# Patient Record
Sex: Female | Born: 1940
Health system: Southern US, Community
[De-identification: ages and names within clinical notes are randomized; demographics above are authoritative.]

## PROBLEM LIST (undated history)

## (undated) DIAGNOSIS — F419 Anxiety disorder, unspecified: Secondary | ICD-10-CM

## (undated) DIAGNOSIS — K449 Diaphragmatic hernia without obstruction or gangrene: Secondary | ICD-10-CM

## (undated) DIAGNOSIS — M353 Polymyalgia rheumatica: Secondary | ICD-10-CM

## (undated) DIAGNOSIS — H9190 Unspecified hearing loss, unspecified ear: Secondary | ICD-10-CM

## (undated) DIAGNOSIS — G039 Meningitis, unspecified: Secondary | ICD-10-CM

## (undated) DIAGNOSIS — IMO0002 Reserved for concepts with insufficient information to code with codable children: Secondary | ICD-10-CM

## (undated) DIAGNOSIS — H269 Unspecified cataract: Secondary | ICD-10-CM

## (undated) DIAGNOSIS — I080 Rheumatic disorders of both mitral and aortic valves: Secondary | ICD-10-CM

## (undated) HISTORY — PX: CATARACT EXTRACTION: SUR2

## (undated) HISTORY — DX: Meningitis, unspecified: G03.9

## (undated) HISTORY — DX: Unspecified cataract: H26.9

## (undated) HISTORY — PX: UPPER GI ENDOSCOPY: SHX6162

## (undated) HISTORY — DX: Diaphragmatic hernia without obstruction or gangrene: K44.9

## (undated) HISTORY — DX: Reserved for concepts with insufficient information to code with codable children: IMO0002

## (undated) HISTORY — DX: Polymyalgia rheumatica: M35.3

## (undated) HISTORY — DX: Anxiety disorder, unspecified: F41.9

## (undated) HISTORY — DX: Rheumatic disorders of both mitral and aortic valves: I08.0

## (undated) HISTORY — DX: Unspecified hearing loss, unspecified ear: H91.90

---

## 1987-09-02 HISTORY — PX: OTHER SURGICAL HISTORY: SHX169

## 1987-09-02 HISTORY — PX: ABDOMINAL HYSTERECTOMY: SHX81

## 2007-09-02 HISTORY — PX: COLONOSCOPY: SHX174

## 2008-09-01 DIAGNOSIS — IMO0002 Reserved for concepts with insufficient information to code with codable children: Secondary | ICD-10-CM

## 2008-09-01 HISTORY — DX: Reserved for concepts with insufficient information to code with codable children: IMO0002

## 2009-03-22 LAB — HM COLONOSCOPY

## 2009-06-03 ENCOUNTER — Emergency Department: Payer: Self-pay | Admitting: Emergency Medicine

## 2009-06-14 DIAGNOSIS — F411 Generalized anxiety disorder: Secondary | ICD-10-CM | POA: Insufficient documentation

## 2009-10-04 DIAGNOSIS — R7303 Prediabetes: Secondary | ICD-10-CM | POA: Insufficient documentation

## 2009-10-04 DIAGNOSIS — K2940 Chronic atrophic gastritis without bleeding: Secondary | ICD-10-CM | POA: Insufficient documentation

## 2009-10-04 DIAGNOSIS — M353 Polymyalgia rheumatica: Secondary | ICD-10-CM | POA: Insufficient documentation

## 2009-10-04 DIAGNOSIS — K648 Other hemorrhoids: Secondary | ICD-10-CM | POA: Insufficient documentation

## 2009-10-18 ENCOUNTER — Encounter: Payer: Self-pay | Admitting: Cardiovascular Disease

## 2009-10-18 DIAGNOSIS — I080 Rheumatic disorders of both mitral and aortic valves: Secondary | ICD-10-CM | POA: Insufficient documentation

## 2009-10-18 LAB — HM PAP SMEAR: HM Pap smear: NEGATIVE

## 2009-10-22 DIAGNOSIS — E7800 Pure hypercholesterolemia, unspecified: Secondary | ICD-10-CM | POA: Insufficient documentation

## 2010-03-12 ENCOUNTER — Ambulatory Visit: Payer: Self-pay | Admitting: Family Medicine

## 2010-04-16 ENCOUNTER — Encounter: Payer: Self-pay | Admitting: Cardiovascular Disease

## 2010-04-26 ENCOUNTER — Ambulatory Visit: Payer: Self-pay | Admitting: Cardiovascular Disease

## 2010-04-26 DIAGNOSIS — R002 Palpitations: Secondary | ICD-10-CM | POA: Insufficient documentation

## 2010-04-26 DIAGNOSIS — I080 Rheumatic disorders of both mitral and aortic valves: Secondary | ICD-10-CM | POA: Insufficient documentation

## 2010-04-29 ENCOUNTER — Telehealth: Payer: Self-pay | Admitting: Cardiovascular Disease

## 2010-10-01 NOTE — Letter (Signed)
Summary: PHI  PHI   Imported By: Harlon Flor 05/13/2010 15:57:08  _____________________________________________________________________  External Attachment:    Type:   Image     Comment:   External Document

## 2010-10-01 NOTE — Assessment & Plan Note (Signed)
Summary: NP6/AMD   Visit Type:  Initial Consult Primary Provider:  Danford Bad Smith,M.D.  CC:  Occas. has palpitations and shortness of breath.  Has hx of mitral and arota valve insufficency. Denies dizziness or chest pain.Marland Kitchen  History of Present Illness: Lindsey Vaughan is a very pleasant 70 year old woman with a long history of palpitations, polymyalgia rheumatica, anxiety, hyperlipidemia who presents for evaluation of palpitations and valvular insufficiency.  Overall, she reports that she has been doing very well. She is active with no symptoms of shortness of breath. She denies worsening lower extremity edema. Her palpitations have significantly improved on Lexapro and on beta blockers. Overall, she is happy with no complaints. She was recently started on simvastatin and aspirin. She swims 30 minutes 3 times per week with no significant symptoms and no change in her exertional capacity over several years.  EKG from Dr. Katrinka Blazing office shows normal sinus rhythm with rate 83 beats per minute, no significant ST or T wave changes.  Labs from 04/2010 shows total cholesterol 232, LDL 153, HDL 56, triglycerides 116  Current Medications (verified): 1)  Prilosec 20 Mg Cpdr (Omeprazole) .... One Tablet Once Daily 2)  Lexapro 10 Mg Tabs (Escitalopram Oxalate) .... One Tablet Once Daily For Anxiety 3)  Inderal La 160 Mg Xr24h-Cap (Propranolol Hcl) .... One Tablet Once Daily 4)  Opththalmic Solution 0.05% ( Eye Drop) .... One Drop Twice A Day. 5)  Calcium/vitamin D/minerals 600-200 Mg-Unit Tabs (Calcium Carbonate-Vit D-Min) .... One Tablet Once Daily 6)  B Complex  Tabs (B Complex Vitamins) .... One Tablet Once Daily 7)  Magnesium 500 Mg Tabs (Magnesium) .... One Tablet Once Daily 8)  Fish Oil Concentrate 1000 Mg Caps (Omega-3 Fatty Acids) .... Two Tablet Daily 9)  Simvastatin 20 Mg Tabs (Simvastatin) .... One Tablet At Bedtime  Allergies (verified): No Known Drug Allergies  Past History:  Past Surgical  History: Last updated: 04/19/2010 hysterectomy. 1989--prolapsed cervix colonoscopy.2009 colonoscopy polymyalgia rheumatica 2010 squamous cell carcinoma upper back 2010  Family History: Last updated: 04/19/2010 Family History of Coronary Artery Disease: Father deceased age 62 with MI. Mother deceased age 58 with pneumonia. She had illness such as CAD, hypertension and tobacco abuse.  Social History: Last updated: 04/19/2010 Married Retired  Tobacco Use - No.  Alcohol Use - yes--two glasses wine on the weekend Regular Exercise - yes--swims,walks and attends exercise class. Swims twice a week. Walks four day a week.  Risk Factors: Exercise: yes (04/19/2010)  Risk Factors: Smoking Status: never (04/19/2010)  Past Medical History: Hypercholesterolemia anxiety Mitral & Aortic valve insufficency; shown on Echo 2006 in [place] GERD hiatal hernia cataract  polymyalgia rhematica  partial deaf left ear spinal meningitis at 40 months old squamous skin cancer upper back 2010 glucose intolerance palpitations  Review of Systems  The patient denies fever, weight loss, weight gain, vision loss, decreased hearing, hoarseness, chest pain, syncope, dyspnea on exertion, peripheral edema, prolonged cough, abdominal pain, incontinence, muscle weakness, depression, and enlarged lymph nodes.         Rare palpitations  Vital Signs:  Patient profile:   70 year old female Height:      66 inches Weight:      157 pounds BMI:     25.43 Pulse rate:   76 / minute BP sitting:   150 / 78  (left arm) Cuff size:   regular  Vitals Entered By: Bishop Dublin, CMA (April 26, 2010 3:30 PM)  Physical Exam  General:  Well developed, well nourished, in  no acute distress. Head:  normocephalic and atraumatic Neck:  Neck supple, no JVD. No masses, thyromegaly or abnormal cervical nodes. Lungs:  Clear bilaterally to auscultation and percussion. Heart:  Non-displaced PMI, chest non-tender; regular  rate and rhythm, S1, S2 without murmurs, rubs or gallops. Carotid upstroke normal, no bruit.  Pedals normal pulses. No edema, no varicosities. Abdomen:  abdomen soft and non-tender without masses Msk:  Back normal, normal gait. Muscle strength and tone normal. Pulses:  pulses normal in all 4 extremities Extremities:  No clubbing or cyanosis. Neurologic:  Alert and oriented x 3. Skin:  Intact without lesions or rashes. Psych:  Normal affect.   Impression & Recommendations:  Problem # 1:  MITRAL REGURG W/ AORTIC INSUFF, RHEUM/NON-RHEUM (ICD-396.3) by report, she has mild aortic and mitral valve regurgitation. She has no clinical signs of worsening valvular disease. This is also not audible on clinical exam. She has good exercise tolerance. I discussed this with her and she feels comfortable and holding off on a repeat echocardiogram at this time. Certainly if she is concerned about her valvular disease or has worsening symptoms, we could repeat an echocardiogram at any time and I have suggested that she call our office to schedule the echocardiogram.  Problem # 2:  PALPITATIONS (ICD-785.1) she has had a long history of palpitations. She is not particularly concerned with her rare ectopy and it seems to be well-controlled on Lexapro and beta blockers.  Her updated medication list for this problem includes:    Inderal La 160 Mg Xr24h-cap (Propranolol hcl) ..... One tablet once daily  Problem # 3:  HYPERLIPIDEMIA-MIXED (ICD-272.4) We did to her about her lipids. I encouraged her to start her simvastatin 20 mg daily. I suspect that she may need a higher dose or a stronger medicine. Potential goal LDL could be LDL less than 100. To achieve this, we might need Lipitor when it goes generic.  Her updated medication list for this problem includes:    Simvastatin 20 Mg Tabs (Simvastatin) ..... One tablet at bedtime  Patient Instructions: 1)  Your physician recommends that you schedule a follow-up  appointment in: as needed  2)  Your physician recommends that you continue on your current medications as directed. Please refer to the Current Medication list given to you today.

## 2010-10-01 NOTE — Progress Notes (Signed)
Summary: MEDICATION  Phone Note Call from Patient Call back at Home Phone [phone]   Caller: SELF Call For: [facility] Summary of Call: PT FORGOT TO GET THE RX FROM [facility] FOR PRE MEDICATION FOR DENTAL WORK-WOULD LIKE TO GET THAT NOW-SHE THINKS IT IS AMOXYCILLAN Initial call taken by: Harlon Flor,  April 29, 2010 12:22 PM  Follow-up for Phone Call        [facility] to call office Follow-up by: Bishop Dublin, CMA,  April 30, 2010 1:54 PM  Additional Follow-up for Phone Call Additional follow up Details #1::        notified patient regarding antibiotic prior to dental work.  She said if needs it just to call and let her know.  She doesn't have any dental work any time soon just was not sure if needed. Additional Follow-up by: Bishop Dublin, CMA,  May 01, 2010 10:12 AM    Additional Follow-up for Phone Call Additional follow up Details #2::    NO antibiotic needed before dental procedures. She does not have mechanical valve/surgical valve   Appended Document: MEDICATION Notified patient does not need pre medication for dental procedures.

## 2011-03-17 ENCOUNTER — Encounter: Payer: Self-pay | Admitting: Cardiovascular Disease

## 2011-06-13 ENCOUNTER — Encounter: Payer: Self-pay | Admitting: Cardiovascular Disease

## 2011-06-13 ENCOUNTER — Ambulatory Visit: Payer: Self-pay | Admitting: Family Medicine

## 2011-06-17 ENCOUNTER — Encounter: Payer: Self-pay | Admitting: Cardiovascular Disease

## 2011-06-17 ENCOUNTER — Ambulatory Visit (INDEPENDENT_AMBULATORY_CARE_PROVIDER_SITE_OTHER): Payer: Medicare Other | Admitting: Cardiovascular Disease

## 2011-06-17 DIAGNOSIS — E785 Hyperlipidemia, unspecified: Secondary | ICD-10-CM

## 2011-06-17 DIAGNOSIS — I080 Rheumatic disorders of both mitral and aortic valves: Secondary | ICD-10-CM

## 2011-06-17 DIAGNOSIS — R002 Palpitations: Secondary | ICD-10-CM

## 2011-06-17 NOTE — Assessment & Plan Note (Signed)
Reported history of trace to mild mitral and aortic valve insufficiency in [place] on echocardiography in 2006. She does not have any clinical signs of significant valvular regurgitation. She is active. Clinical exam is essentially benign. No further testing is needed at this time. We have suggested that she can followup on an as-needed basis.

## 2011-06-17 NOTE — Progress Notes (Signed)
Patient ID: Lindsey Vaughan, female    DOB: 19-Apr-1941, 70 y.o.   MRN: [id number]  HPI Comments: Lindsey Vaughan is a very pleasant 70 year old woman with a long history of palpitations, polymyalgia rheumatica, anxiety, hyperlipidemia who presents for Routine followup for her aortic and mitral valve insufficiency and palpitations.   she reports that she has been doing very well. She is active with no symptoms of shortness of breath. She denies worsening lower extremity edema. Her palpitations have significantly improved No she does continue to have occasional episodes during the daytime. She is not particularly interested in changing her medications for these palpitations. Overall, she is happy with no complaints. She is taking  simvastatin and aspiri 81 mg daily. She swims 30 minutes 3 times per week with no significant symptoms and no change in her exertional capacity over several years.   EKG from Dr. Katrinka Blazing office shows normal sinus rhythm with rate 76 beats per minute, no significant ST or T wave changes. Most recent cholesterol panel is not available for our review at this time    Outpatient Encounter Prescriptions as of 06/17/2011  Medication Sig Dispense Refill  . aspirin 81 MG tablet Take 81 mg by mouth daily.        Marland Kitchen b complex vitamins tablet Take 1 tablet by mouth daily.        . Calcium Carbonate-Vit D-Min (CALCIUM/VITAMIN D/MINERALS) 600-200 MG-UNIT TABS Take 1 tablet by mouth daily.        Marland Kitchen escitalopram (LEXAPRO) 10 MG tablet Take 10 mg by mouth daily.        . magnesium gluconate (MAGONATE) 500 MG tablet Take 500 mg by mouth 2 (two) times daily.        . Omega-3 Fatty Acids (FISH OIL CONCENTRATE) 1000 MG CAPS Take 2 capsules by mouth daily.        Marland Kitchen omeprazole (PRILOSEC) 20 MG capsule Take 20 mg by mouth daily.        . propranolol (INDERAL LA) 160 MG SR capsule Take 160 mg by mouth daily.        . simvastatin (ZOCOR) 20 MG tablet Take 20 mg by mouth at bedtime.        Marland Kitchen  tetrahydrozoline 0.05 % ophthalmic solution 1 drop 2 (two) times daily.           Review of Systems  Constitutional: Negative.   HENT: Negative.   Eyes: Negative.   Respiratory: Negative.   Cardiovascular: Negative.   Gastrointestinal: Negative.   Musculoskeletal: Negative.   Skin: Negative.   Neurological: Negative.   Hematological: Negative.   Psychiatric/Behavioral: Negative.   All other systems reviewed and are negative.    BP 120/70  Pulse 76  Ht 5\' 6"  (1.676 m)  Wt 153 lb (69.4 kg)  BMI 24.69 kg/m2   Physical Exam  Nursing note and vitals reviewed. Constitutional: She is oriented to person, place, and time. She appears well-developed and well-nourished.  HENT:  Head: Normocephalic.  Nose: Nose normal.  Mouth/Throat: Oropharynx is clear and moist.  Eyes: Conjunctivae are normal. Pupils are equal, round, and reactive to light.  Neck: Normal range of motion. Neck supple. No JVD present.  Cardiovascular: Normal rate, regular rhythm, S1 normal, S2 normal, normal heart sounds and intact distal pulses.  Exam reveals no gallop and no friction rub.   No murmur heard. Pulmonary/Chest: Effort normal and breath sounds normal. No respiratory distress. She has no wheezes. She has no rales. She exhibits no tenderness.  Abdominal: Soft. Bowel sounds are normal. She exhibits no distension. There is no tenderness.  Musculoskeletal: Normal range of motion. She exhibits no edema and no tenderness.  Lymphadenopathy:    She has no cervical adenopathy.  Neurological: She is alert and oriented to person, place, and time. Coordination normal.  Skin: Skin is warm and dry. No rash noted. No erythema.  Psychiatric: She has a normal mood and affect. Her behavior is normal. Judgment and thought content normal.         Assessment and Plan

## 2011-06-17 NOTE — Patient Instructions (Signed)
You are doing well. No medication changes were made.  Please call us if you have new issues that need to be addressed before your next appt.  The office will contact you for a follow up Appt. In 12 months  

## 2011-06-17 NOTE — Assessment & Plan Note (Signed)
Complemented her on her compliance with the statin and aspirin. She does have a very strong family history of stroke and coronary artery disease as well as heart failure. I would suggest aggressive treatment of her lipids given his strong history. She does report her sister recently had 2 strokes.

## 2011-06-17 NOTE — Assessment & Plan Note (Addendum)
She is not particularly bothered by her palpitations. They are occasional. No further medication changes were made. She could add propranolol short-acting p.r.n. For breakthrough palpitations if needed.

## 2011-12-31 HISTORY — PX: CATARACT EXTRACTION: SUR2

## 2012-01-14 ENCOUNTER — Ambulatory Visit: Payer: Self-pay | Admitting: Ophthalmology

## 2012-01-26 ENCOUNTER — Ambulatory Visit: Payer: Self-pay | Admitting: Ophthalmology

## 2012-05-24 ENCOUNTER — Ambulatory Visit (INDEPENDENT_AMBULATORY_CARE_PROVIDER_SITE_OTHER): Payer: Medicare Other | Admitting: Cardiovascular Disease

## 2012-05-24 ENCOUNTER — Encounter: Payer: Self-pay | Admitting: Cardiovascular Disease

## 2012-05-24 VITALS — BP 140/80 | HR 76 | Ht 66.0 in | Wt 153.2 lb

## 2012-05-24 DIAGNOSIS — I080 Rheumatic disorders of both mitral and aortic valves: Secondary | ICD-10-CM

## 2012-05-24 DIAGNOSIS — R002 Palpitations: Secondary | ICD-10-CM

## 2012-05-24 DIAGNOSIS — I10 Essential (primary) hypertension: Secondary | ICD-10-CM

## 2012-05-24 MED ORDER — PROPRANOLOL HCL 20 MG PO TABS: 20.0000 mg | ORAL_TABLET | Freq: Three times a day (TID) | ORAL | Status: DC | PRN

## 2012-05-24 NOTE — Progress Notes (Signed)
Patient ID: Lindsey Vaughan, female    DOB: [birth year], 71 y.o.   MRN: [id number]  HPI Comments: Ms. Lindsey Vaughan is a very pleasant 71 year old woman with a long history of palpitations, polymyalgia rheumatica, anxiety, hyperlipidemia who presents for Routine followup for her aortic and mitral valve insufficiency and palpitations.   she reports that she has been doing very well. She is active with no symptoms of shortness of breath. She denies worsening lower extremity edema. Her palpitations have significantly improved.  She does report having periodic irregular heart beats, also periodically her blood pressure will be very low. She was started initially on propranolol years ago for palpitations and ectopy. Occasional systolic pressures in the low 409W, heart rate in the 50s to 60s  EKG from Dr. Katrinka Blazing office shows normal sinus rhythm with rate 63 beats per minute, no significant ST or T wave changes.      Outpatient Encounter Prescriptions as of 05/24/2012  Medication Sig Dispense Refill  . aspirin 81 MG tablet Take 81 mg by mouth 2 (two) times daily.       Marland Kitchen b complex vitamins tablet Take 1 tablet by mouth daily.        . Calcium Carbonate-Vit D-Min (CALCIUM/VITAMIN D/MINERALS) 600-200 MG-UNIT TABS Take 1 tablet by mouth daily.        Marland Kitchen doxycycline (VIBRAMYCIN) 100 MG capsule Take 100 mg by mouth daily.      Marland Kitchen escitalopram (LEXAPRO) 10 MG tablet Take 10 mg by mouth daily.        . magnesium gluconate (MAGONATE) 500 MG tablet Take 500 mg by mouth 2 (two) times daily.        . Omega-3 Fatty Acids (FISH OIL CONCENTRATE) 1000 MG CAPS Take 2 capsules by mouth daily.        Marland Kitchen omeprazole (PRILOSEC) 20 MG capsule Take 20 mg by mouth daily.        . propranolol (INDERAL LA) 60 MG SR capsule Take 60 mg by mouth daily.        . simvastatin (ZOCOR) 20 MG tablet Take 20 mg by mouth at bedtime.        Marland Kitchen tetrahydrozoline 0.05 % ophthalmic solution 1 drop 2 (two) times daily.           Review of Systems    Constitutional: Negative.   HENT: Negative.   Eyes: Negative.   Respiratory: Negative.   Cardiovascular: Positive for palpitations.  Gastrointestinal: Negative.   Musculoskeletal: Negative.   Skin: Negative.   Neurological: Positive for dizziness.  Hematological: Negative.   Psychiatric/Behavioral: Negative.   All other systems reviewed and are negative.    BP 140/80  Pulse 76  Ht 5\' 6"  (1.676 m)  Wt 153 lb 4 oz (69.514 kg)  BMI 24.74 kg/m2  Physical Exam  Nursing note and vitals reviewed. Constitutional: She is oriented to person, place, and time. She appears well-developed and well-nourished.  HENT:  Head: Normocephalic.  Nose: Nose normal.  Mouth/Throat: Oropharynx is clear and moist.  Eyes: Conjunctivae normal are normal. Pupils are equal, round, and reactive to light.  Neck: Normal range of motion. Neck supple. No JVD present.  Cardiovascular: Normal rate, regular rhythm, S1 normal, S2 normal, normal heart sounds and intact distal pulses.  Exam reveals no gallop and no friction rub.   No murmur heard. Pulmonary/Chest: Effort normal and breath sounds normal. No respiratory distress. She has no wheezes. She has no rales. She exhibits no tenderness.  Abdominal: Soft. Bowel sounds are  normal. She exhibits no distension. There is no tenderness.  Musculoskeletal: Normal range of motion. She exhibits no edema and no tenderness.  Lymphadenopathy:    She has no cervical adenopathy.  Neurological: She is alert and oriented to person, place, and time. Coordination normal.  Skin: Skin is warm and dry. No rash noted. No erythema.  Psychiatric: She has a normal mood and affect. Her behavior is normal. Judgment and thought content normal.         Assessment and Plan

## 2012-05-24 NOTE — Assessment & Plan Note (Signed)
We'll decrease her propranolol given recent hypotension

## 2012-05-24 NOTE — Assessment & Plan Note (Signed)
She reports having low blood pressure on propranolol 60 mg daily. We will change her prescription to propranolol 20 mg twice a day. If blood pressure continues to run low, she can decrease the dose even further.

## 2012-05-24 NOTE — Patient Instructions (Addendum)
You are doing well. Please change propranolol from 60 mg daily to 20 mg twice a day  Please call us if you have new issues that need to be addressed before your next appt.  Your physician wants you to follow-up in: 12months.  You will receive a reminder letter in the mail two months in advance. If you don't receive a letter, please call our office to schedule the follow-up appointment.

## 2012-05-24 NOTE — Assessment & Plan Note (Signed)
No clinical signs of worsening aortic or mitral valve disease. Clinically, no murmur appreciated.

## 2012-06-24 ENCOUNTER — Ambulatory Visit: Payer: Self-pay | Admitting: Family Medicine

## 2012-06-24 LAB — HM DEXA SCAN

## 2012-07-08 ENCOUNTER — Ambulatory Visit: Payer: Medicare Other | Admitting: Internal Medicine

## 2012-09-01 DIAGNOSIS — H269 Unspecified cataract: Secondary | ICD-10-CM

## 2012-09-01 HISTORY — DX: Unspecified cataract: H26.9

## 2012-10-05 ENCOUNTER — Ambulatory Visit: Payer: Self-pay | Admitting: Family Medicine

## 2012-10-16 ENCOUNTER — Other Ambulatory Visit: Payer: Self-pay

## 2013-01-13 ENCOUNTER — Ambulatory Visit: Payer: Self-pay | Admitting: Ophthalmology

## 2013-04-06 ENCOUNTER — Other Ambulatory Visit: Payer: Self-pay

## 2013-05-30 ENCOUNTER — Ambulatory Visit: Payer: Medicare Other | Admitting: Cardiovascular Disease

## 2013-07-04 DIAGNOSIS — N393 Stress incontinence (female) (male): Secondary | ICD-10-CM | POA: Insufficient documentation

## 2013-07-04 DIAGNOSIS — R3129 Other microscopic hematuria: Secondary | ICD-10-CM | POA: Insufficient documentation

## 2013-07-07 ENCOUNTER — Other Ambulatory Visit: Payer: Self-pay

## 2013-07-11 DIAGNOSIS — N8111 Cystocele, midline: Secondary | ICD-10-CM | POA: Insufficient documentation

## 2013-07-11 DIAGNOSIS — N362 Urethral caruncle: Secondary | ICD-10-CM | POA: Insufficient documentation

## 2013-07-26 ENCOUNTER — Ambulatory Visit (INDEPENDENT_AMBULATORY_CARE_PROVIDER_SITE_OTHER): Payer: Medicare Other | Admitting: Cardiovascular Disease

## 2013-07-26 ENCOUNTER — Encounter: Payer: Self-pay | Admitting: Cardiovascular Disease

## 2013-07-26 VITALS — BP 122/68 | HR 72 | Ht 66.5 in | Wt 151.5 lb

## 2013-07-26 DIAGNOSIS — I10 Essential (primary) hypertension: Secondary | ICD-10-CM

## 2013-07-26 DIAGNOSIS — E785 Hyperlipidemia, unspecified: Secondary | ICD-10-CM

## 2013-07-26 DIAGNOSIS — Z01810 Encounter for preprocedural cardiovascular examination: Secondary | ICD-10-CM | POA: Insufficient documentation

## 2013-07-26 DIAGNOSIS — I080 Rheumatic disorders of both mitral and aortic valves: Secondary | ICD-10-CM

## 2013-07-26 DIAGNOSIS — R002 Palpitations: Secondary | ICD-10-CM

## 2013-07-26 NOTE — Assessment & Plan Note (Signed)
Clinically not a significant finding. No murmur on exam   

## 2013-07-26 NOTE — Assessment & Plan Note (Signed)
Blood pressure is well controlled on today's visit. No changes made to the medications. 

## 2013-07-26 NOTE — Assessment & Plan Note (Signed)
Acceptable risk for upcoming cataract surgery. No further workup needed

## 2013-07-26 NOTE — Assessment & Plan Note (Signed)
Encouraged her to stay on her statin 

## 2013-07-26 NOTE — Patient Instructions (Signed)
You are doing well. No medication changes were made.  Please call us if you have new issues that need to be addressed before your next appt.  Your physician wants you to follow-up in: 12 months.  You will receive a reminder letter in the mail two months in advance. If you don't receive a letter, please call our office to schedule the follow-up appointment. 

## 2013-07-26 NOTE — Progress Notes (Signed)
Patient ID: Lindsey Vaughan, female    DOB: [birth year], 72 y.o.   MRN: [id number]  HPI Comments: Lindsey Vaughan is a very pleasant 72 year old woman with a long history of palpitations, polymyalgia rheumatica, anxiety, hyperlipidemia who presents for routine followup for her aortic and mitral valve insufficiency and palpitations.   she reports that she has been doing very well. She is active with no symptoms of shortness of breath. She denies worsening lower extremity edema. Her palpitations have significantly improved on beta blockers. She is exercising 3-4 times per week, active on other days. No symptoms noted. She is scheduled for cataract surgery in December 2014 Weight is well controlled. Overall she is happy with her current status. Previous cholesterol 159, LDL 75 in March 2013  EKG shows normal sinus rhythm with no significant ST or T wave changes.      Outpatient Encounter Prescriptions as of 07/26/2013  Medication Sig  . aspirin 81 MG tablet Take 81 mg by mouth 2 (two) times daily.   Marland Kitchen b complex vitamins tablet Take 1 tablet by mouth daily.    . Calcium Carbonate-Vit D-Min (CALCIUM/VITAMIN D/MINERALS) 600-200 MG-UNIT TABS Take 1 tablet by mouth daily.    Marland Kitchen doxycycline (VIBRAMYCIN) 100 MG capsule Take 100 mg by mouth daily.  Marland Kitchen escitalopram (LEXAPRO) 10 MG tablet Take 10 mg by mouth daily.    . magnesium gluconate (MAGONATE) 500 MG tablet Take 500 mg by mouth 2 (two) times daily.    . Omega-3 Fatty Acids (FISH OIL CONCENTRATE) 1000 MG CAPS Take 2 capsules by mouth daily.    Marland Kitchen omeprazole (PRILOSEC) 20 MG capsule Take 20 mg by mouth daily.    . propranolol ER (INDERAL LA) 60 MG 24 hr capsule Take 60 mg by mouth daily.  . simvastatin (ZOCOR) 20 MG tablet Take 20 mg by mouth at bedtime.    Marland Kitchen tetrahydrozoline 0.05 % ophthalmic solution 1 drop 2 (two) times daily.    . [DISCONTINUED] propranolol (INDERAL LA) 160 MG SR capsule Take 160 mg by mouth daily.    . [DISCONTINUED] propranolol  (INDERAL) 20 MG tablet Take 1 tablet (20 mg total) by mouth 3 (three) times daily as needed.     Review of Systems  Constitutional: Negative.   HENT: Negative.   Eyes: Negative.   Respiratory: Negative.   Gastrointestinal: Negative.   Musculoskeletal: Negative.   Skin: Negative.   Psychiatric/Behavioral: Negative.   All other systems reviewed and are negative.    BP 122/68  Pulse 72  Ht 5' 6.5" (1.689 m)  Wt 151 lb 8 oz (68.72 kg)  BMI 24.09 kg/m2  Physical Exam  Nursing note and vitals reviewed. Constitutional: She is oriented to person, place, and time. She appears well-developed and well-nourished.  HENT:  Head: Normocephalic.  Nose: Nose normal.  Mouth/Throat: Oropharynx is clear and moist.  Eyes: Conjunctivae are normal. Pupils are equal, round, and reactive to light.  Neck: Normal range of motion. Neck supple. No JVD present.  Cardiovascular: Normal rate, regular rhythm, S1 normal, S2 normal, normal heart sounds and intact distal pulses.  Exam reveals no gallop and no friction rub.   No murmur heard. Pulmonary/Chest: Effort normal and breath sounds normal. No respiratory distress. She has no wheezes. She has no rales. She exhibits no tenderness.  Abdominal: Soft. Bowel sounds are normal. She exhibits no distension. There is no tenderness.  Musculoskeletal: Normal range of motion. She exhibits no edema and no tenderness.  Lymphadenopathy:    She has  no cervical adenopathy.  Neurological: She is alert and oriented to person, place, and time. Coordination normal.  Skin: Skin is warm and dry. No rash noted. No erythema.  Psychiatric: She has a normal mood and affect. Her behavior is normal. Judgment and thought content normal.    Assessment and Plan

## 2013-07-26 NOTE — Assessment & Plan Note (Signed)
No significant palpitations on her propranolol. No medication changes made

## 2013-08-15 ENCOUNTER — Ambulatory Visit: Payer: Self-pay | Admitting: Ophthalmology

## 2013-10-11 ENCOUNTER — Ambulatory Visit: Payer: Self-pay | Admitting: Family Medicine

## 2014-03-14 ENCOUNTER — Ambulatory Visit: Payer: Self-pay | Admitting: Gastroenterology

## 2014-03-15 LAB — PATHOLOGY REPORT

## 2014-09-07 ENCOUNTER — Encounter: Payer: Self-pay | Admitting: Cardiovascular Disease

## 2014-09-07 ENCOUNTER — Ambulatory Visit (INDEPENDENT_AMBULATORY_CARE_PROVIDER_SITE_OTHER): Payer: Medicare Other | Admitting: Cardiovascular Disease

## 2014-09-07 VITALS — BP 130/78 | HR 73 | Ht 66.0 in | Wt 153.5 lb

## 2014-09-07 DIAGNOSIS — I080 Rheumatic disorders of both mitral and aortic valves: Secondary | ICD-10-CM | POA: Diagnosis not present

## 2014-09-07 DIAGNOSIS — E785 Hyperlipidemia, unspecified: Secondary | ICD-10-CM

## 2014-09-07 DIAGNOSIS — R002 Palpitations: Secondary | ICD-10-CM

## 2014-09-07 DIAGNOSIS — I10 Essential (primary) hypertension: Secondary | ICD-10-CM | POA: Diagnosis not present

## 2014-09-07 MED ORDER — PROPRANOLOL HCL 20 MG PO TABS: 20.0000 mg | ORAL_TABLET | Freq: Three times a day (TID) | ORAL | Status: DC | PRN

## 2014-09-07 NOTE — Progress Notes (Signed)
Patient ID: Lindsey Vaughan, female    DOB: Mar 18, 1941, 74 y.o.   MRN: [id number]  HPI Comments: Lindsey Vaughan is a very pleasant 74 year old woman with a long history of palpitations, polymyalgia rheumatica, anxiety, hyperlipidemia who presents for routine followup for her aortic and mitral valve insufficiency and palpitations.  In follow-up, she reports that she has been weaning down on the Inderal as her symptoms of palpitations have significantly improved. She currently takes propranolol 20 mg in the morning. Denies having any breakthrough palpitations. She is otherwise active, blood pressure has been well-controlled with systolic pressure 542 into the 120s over 50s She is exercising 3-4 times per week, active on other days.  Review of her lab work shows total cholesterol 159, LDL 75  EKG on today's visit shows normal sinus rhythm with rate 73 bpm, no significant ST or T-wave changes     No Known Allergies  Outpatient Encounter Prescriptions as of 09/07/2014  Medication Sig  . aspirin 81 MG tablet Take 81 mg by mouth 2 (two) times daily.   Marland Kitchen b complex vitamins tablet Take 1 tablet by mouth daily.    . Calcium Carbonate-Vit D-Min (CALCIUM/VITAMIN D/MINERALS) 600-200 MG-UNIT TABS Take 1 tablet by mouth daily.    Marland Kitchen doxycycline (VIBRAMYCIN) 100 MG capsule Take 100 mg by mouth daily.  Marland Kitchen escitalopram (LEXAPRO) 10 MG tablet Take 10 mg by mouth daily.    . magnesium gluconate (MAGONATE) 500 MG tablet Take 500 mg by mouth 2 (two) times daily.    Marland Kitchen omeprazole (PRILOSEC) 20 MG capsule Take 20 mg by mouth daily.    . propranolol (INDERAL) 20 MG tablet Take 1 tablet (20 mg total) by mouth 3 (three) times daily as needed.  . simvastatin (ZOCOR) 20 MG tablet Take 20 mg by mouth at bedtime.    Marland Kitchen tetrahydrozoline 0.05 % ophthalmic solution 1 drop 2 (two) times daily.    . [DISCONTINUED] propranolol (INDERAL) 20 MG tablet Take 20 mg by mouth daily.  . [DISCONTINUED] Omega-3 Fatty Acids (FISH OIL  CONCENTRATE) 1000 MG CAPS Take 2 capsules by mouth daily.    . [DISCONTINUED] propranolol ER (INDERAL LA) 60 MG 24 hr capsule Take 60 mg by mouth daily.    Past Medical History  Diagnosis Date  . Hypercholesterolemia   . Anxiety   . Mitral valve insufficiency and aortic valve insufficiency     shown on echo 2006 in [place]  . GERD (gastroesophageal reflux disease)   . Hiatal hernia   . Cataract   . Polymyalgia rheumatica   . Partial deafness     L ear  . Meningitis spinal     9 months old   . Squamous cell carcinoma 2010    upper back   . Glucose intolerance (impaired glucose tolerance)   . Palpitations   . Cataract 2014    left eye    Past Surgical History  Procedure Laterality Date  . Hysterectomy - prolapsed cervix  1989  . Colonoscopy  2009  . Cataract extraction    . Upper gi endoscopy      Social History  reports that she has never smoked. She does not have any smokeless tobacco history on file. She reports that she drinks about 0.6 oz of alcohol per week. She reports that she does not use illicit drugs.  Family History Family history is unknown by patient.      Review of Systems  Constitutional: Negative.   Respiratory: Negative.   Cardiovascular: Negative.  Gastrointestinal: Negative.   Endocrine: Negative.   Musculoskeletal: Negative.   Neurological: Negative.   Hematological: Negative.   Psychiatric/Behavioral: Negative.   All other systems reviewed and are negative.   BP 130/78 mmHg  Pulse 73  Ht 5\' 6"  (1.676 m)  Wt 153 lb 8 oz (69.627 kg)  BMI 24.79 kg/m2  Physical Exam  Constitutional: She is oriented to person, place, and time. She appears well-developed and well-nourished.  HENT:  Head: Normocephalic.  Nose: Nose normal.  Mouth/Throat: Oropharynx is clear and moist.  Eyes: Conjunctivae are normal. Pupils are equal, round, and reactive to light.  Neck: Normal range of motion. Neck supple. No JVD present.  Cardiovascular: Normal rate,  regular rhythm, S1 normal, S2 normal, normal heart sounds and intact distal pulses.  Exam reveals no gallop and no friction rub.   No murmur heard. Pulmonary/Chest: Effort normal and breath sounds normal. No respiratory distress. She has no wheezes. She has no rales. She exhibits no tenderness.  Abdominal: Soft. Bowel sounds are normal. She exhibits no distension. There is no tenderness.  Musculoskeletal: Normal range of motion. She exhibits no edema or tenderness.  Lymphadenopathy:    She has no cervical adenopathy.  Neurological: She is alert and oriented to person, place, and time. Coordination normal.  Skin: Skin is warm and dry. No rash noted. No erythema.  Psychiatric: She has a normal mood and affect. Her behavior is normal. Judgment and thought content normal.    Assessment and Plan  Nursing note and vitals reviewed.

## 2014-09-07 NOTE — Patient Instructions (Signed)
You are doing well. No medication changes were made.  Please call us if you have new issues that need to be addressed before your next appt.  Your physician wants you to follow-up in: 12 months.  You will receive a reminder letter in the mail two months in advance. If you don't receive a letter, please call our office to schedule the follow-up appointment. 

## 2014-09-07 NOTE — Assessment & Plan Note (Signed)
No recent lipid panel available. Previously was well controlled. Suggested she stay on her statin

## 2014-09-07 NOTE — Assessment & Plan Note (Signed)
Clinically not a significant finding. No murmur on exam

## 2014-09-07 NOTE — Assessment & Plan Note (Addendum)
On minimal propranolol. This could likely be weaned off with close monitoring of her blood pressure. Only need propranolol for palpitations

## 2014-09-07 NOTE — Assessment & Plan Note (Signed)
No significant palpitations on her low dose propranolol. No medication changes made Suggested she could try to wean the propranolol as tolerated

## 2014-09-13 DIAGNOSIS — L719 Rosacea, unspecified: Secondary | ICD-10-CM | POA: Diagnosis not present

## 2014-09-25 DIAGNOSIS — L821 Other seborrheic keratosis: Secondary | ICD-10-CM | POA: Diagnosis not present

## 2014-09-25 DIAGNOSIS — L718 Other rosacea: Secondary | ICD-10-CM | POA: Diagnosis not present

## 2014-10-05 DIAGNOSIS — Z23 Encounter for immunization: Secondary | ICD-10-CM | POA: Diagnosis not present

## 2014-10-05 DIAGNOSIS — Z0000 Encounter for general adult medical examination without abnormal findings: Secondary | ICD-10-CM | POA: Diagnosis not present

## 2014-10-05 DIAGNOSIS — R319 Hematuria, unspecified: Secondary | ICD-10-CM | POA: Diagnosis not present

## 2014-10-06 DIAGNOSIS — E780 Pure hypercholesterolemia: Secondary | ICD-10-CM | POA: Diagnosis not present

## 2014-10-06 DIAGNOSIS — R7309 Other abnormal glucose: Secondary | ICD-10-CM | POA: Diagnosis not present

## 2014-10-06 DIAGNOSIS — R319 Hematuria, unspecified: Secondary | ICD-10-CM | POA: Diagnosis not present

## 2014-10-06 DIAGNOSIS — F411 Generalized anxiety disorder: Secondary | ICD-10-CM | POA: Diagnosis not present

## 2014-10-06 DIAGNOSIS — E559 Vitamin D deficiency, unspecified: Secondary | ICD-10-CM | POA: Diagnosis not present

## 2014-10-06 LAB — HEPATIC FUNCTION PANEL
ALT: 15 U/L (ref 7–35)
AST: 21 U/L (ref 13–35)

## 2014-10-06 LAB — LIPID PANEL
Cholesterol: 182 mg/dL (ref 0–200)
HDL: 65 mg/dL (ref 35–70)
LDL Cholesterol: 95 mg/dL
Triglycerides: 109 mg/dL (ref 40–160)

## 2014-10-06 LAB — CBC AND DIFFERENTIAL
HEMATOCRIT: 40 % (ref 36–46)
Hemoglobin: 13.6 g/dL (ref 12.0–16.0)
Platelets: 174 10*3/uL (ref 150–399)
WBC: 4.5 10*3/mL

## 2014-10-06 LAB — BASIC METABOLIC PANEL
BUN: 17 mg/dL (ref 4–21)
Creatinine: 0.8 mg/dL (ref <=1.1)
Glucose: 116 mg/dL
Potassium: 4.2 mmol/L (ref 3.4–5.3)
SODIUM: 144 mmol/L (ref 137–147)

## 2014-10-06 LAB — TSH: TSH: 2.46 u[IU]/mL (ref <=5.90)

## 2014-10-06 LAB — HEMOGLOBIN A1C: Hgb A1c MFr Bld: 6.3 % — AB (ref 4.0–6.0)

## 2014-10-17 ENCOUNTER — Ambulatory Visit: Payer: Self-pay | Admitting: Family Medicine

## 2014-10-17 DIAGNOSIS — Z1231 Encounter for screening mammogram for malignant neoplasm of breast: Secondary | ICD-10-CM | POA: Diagnosis not present

## 2014-10-17 LAB — HM MAMMOGRAPHY

## 2014-10-26 DIAGNOSIS — H9193 Unspecified hearing loss, bilateral: Secondary | ICD-10-CM | POA: Diagnosis not present

## 2014-10-26 DIAGNOSIS — H9190 Unspecified hearing loss, unspecified ear: Secondary | ICD-10-CM | POA: Diagnosis not present

## 2014-12-22 DIAGNOSIS — L719 Rosacea, unspecified: Secondary | ICD-10-CM | POA: Insufficient documentation

## 2014-12-22 DIAGNOSIS — I779 Disorder of arteries and arterioles, unspecified: Secondary | ICD-10-CM | POA: Insufficient documentation

## 2014-12-22 DIAGNOSIS — E559 Vitamin D deficiency, unspecified: Secondary | ICD-10-CM | POA: Insufficient documentation

## 2014-12-22 DIAGNOSIS — M810 Age-related osteoporosis without current pathological fracture: Secondary | ICD-10-CM | POA: Insufficient documentation

## 2014-12-22 DIAGNOSIS — G5701 Lesion of sciatic nerve, right lower limb: Secondary | ICD-10-CM | POA: Insufficient documentation

## 2014-12-22 DIAGNOSIS — K219 Gastro-esophageal reflux disease without esophagitis: Secondary | ICD-10-CM | POA: Insufficient documentation

## 2014-12-22 DIAGNOSIS — C4490 Unspecified malignant neoplasm of skin, unspecified: Secondary | ICD-10-CM | POA: Insufficient documentation

## 2014-12-22 DIAGNOSIS — M1990 Unspecified osteoarthritis, unspecified site: Secondary | ICD-10-CM | POA: Insufficient documentation

## 2014-12-22 DIAGNOSIS — Z8719 Personal history of other diseases of the digestive system: Secondary | ICD-10-CM | POA: Insufficient documentation

## 2014-12-24 NOTE — Op Note (Signed)
PATIENT NAME:  Lindsey Vaughan, Lindsey Vaughan MR#:  [id number] DATE OF BIRTH:  30-Oct-1940  DATE OF PROCEDURE:  01/26/2012  PREOPERATIVE DIAGNOSIS: Cataract, right eye.   POSTOPERATIVE DIAGNOSIS:  Cataract, right eye.  PROCEDURE PERFORMED: Extracapsular cataract extraction using phacoemulsification with placement of an Alcon [id number] 19-diopter posterior chamber lens with a +3 add restore lens, serial number [id number].   SURGEON: Loura Back. Courvoisier Hamblen, M.D.   ANESTHESIA: 4% lidocaine and 0.75% Marcaine in a 50-50 mixture with 10 units/mL of Hylenex added, given as a peribulbar.   ANESTHESIOLOGIST: Dr. Andree Elk   COMPLICATIONS: None.   ESTIMATED BLOOD LOSS: Less than 1 mL.   DESCRIPTION OF PROCEDURE:  The patient was brought to the operating room and given a peribulbar block.  The patient was then prepped and draped in the usual fashion.  The vertical rectus muscles were imbricated using 5-0 silk sutures.  These sutures were then clamped to the sterile drapes as bridle sutures.  A limbal peritomy was performed extending two clock hours and hemostasis was obtained with cautery.  A partial thickness scleral groove was made at the surgical limbus and dissected anteriorly in a lamellar dissection using an Alcon crescent knife.  The anterior chamber was entered superonasally with a Superblade and through the lamellar dissection with a 2.6 mm keratome.  DisCoVisc was used to replace the aqueous and a continuous tear capsulorrhexis was carried out.  Hydrodissection and hydrodelineation were carried out with balanced salt and a 27 gauge canula.  The nucleus was rotated to confirm the effectiveness of the hydrodissection.  Phacoemulsification was carried out using a divide-and-conquer technique.  Total ultrasound time was 1 minute and 13 seconds with an average power of  13.5 percent. CDE 17.47.  Irrigation/aspiration was used to remove the residual cortex.  DisCoVisc was used to inflate the capsule and the internal  incision was enlarged to 3 mm with the crescent knife.  The intraocular lens was folded and inserted into the capsular bag using the Acrysert delivery system.  Irrigation/aspiration was used to remove the residual DisCoVisc.  Miostat was injected into the anterior chamber through the paracentesis track to inflate the anterior chamber and induce miosis.  The wound was checked for leaks and none were found. The conjunctiva was closed with cautery and the bridle sutures were removed.  Two drops of 0.3% Vigamox were placed on the eye.   An eye shield was placed on the eye.  The patient was discharged to the recovery room in good condition.   ____________________________ Loura Back Oscar Forman, MD sad:bjt D: 01/26/2012 13:06:27 ET T: 01/26/2012 14:44:46 ET JOB#: [id number]  cc: Remo Lipps A. Magdeline Prange, MD, <Dictator> Martie Lee MD ELECTRONICALLY SIGNED 02/02/2012 12:53

## 2014-12-26 ENCOUNTER — Other Ambulatory Visit: Payer: Self-pay | Admitting: Gastroenterology

## 2014-12-26 DIAGNOSIS — R1013 Epigastric pain: Secondary | ICD-10-CM | POA: Diagnosis not present

## 2014-12-26 DIAGNOSIS — R1084 Generalized abdominal pain: Secondary | ICD-10-CM

## 2014-12-29 ENCOUNTER — Ambulatory Visit
Admit: 2014-12-29 | Disposition: A | Discharge status: Home / Self Care | Payer: Self-pay | Attending: Gastroenterology | Admitting: Gastroenterology

## 2014-12-29 DIAGNOSIS — R1084 Generalized abdominal pain: Secondary | ICD-10-CM | POA: Diagnosis not present

## 2015-01-01 ENCOUNTER — Ambulatory Visit
Admission: RE | Admit: 2015-01-01 | Discharge: 2015-01-01 | Disposition: A | Discharge status: Home / Self Care | Payer: Medicare Other | Source: Ambulatory Visit | Attending: Gastroenterology | Admitting: Gastroenterology

## 2015-01-01 DIAGNOSIS — R112 Nausea with vomiting, unspecified: Secondary | ICD-10-CM | POA: Diagnosis not present

## 2015-01-01 DIAGNOSIS — R1013 Epigastric pain: Secondary | ICD-10-CM | POA: Insufficient documentation

## 2015-01-01 DIAGNOSIS — R1084 Generalized abdominal pain: Secondary | ICD-10-CM

## 2015-01-01 DIAGNOSIS — R109 Unspecified abdominal pain: Secondary | ICD-10-CM | POA: Diagnosis not present

## 2015-01-01 MED ORDER — TECHNETIUM TC 99M SESTAMIBI - CARDIOLITE: 5.1950 | Freq: Once | INTRAVENOUS | Status: AC | PRN

## 2015-01-01 MED ORDER — SINCALIDE 5 MCG IJ SOLR
0.0200 ug/kg | Freq: Once | INTRAMUSCULAR | Status: AC
  Administered 2015-01-01: 1.4 ug via INTRAVENOUS

## 2015-01-01 MED ORDER — TECHNETIUM TC 99M MEBROFENIN IV KIT
5.1950 | PACK | Freq: Once | INTRAVENOUS | Status: AC | PRN
  Administered 2015-01-01: 5.195 via INTRAVENOUS

## 2015-02-08 ENCOUNTER — Encounter: Payer: Self-pay | Admitting: Family Medicine

## 2015-02-08 ENCOUNTER — Ambulatory Visit (INDEPENDENT_AMBULATORY_CARE_PROVIDER_SITE_OTHER): Payer: Medicare Other | Admitting: Family Medicine

## 2015-02-08 VITALS — BP 120/80 | HR 76 | Temp 97.8°F | Resp 16 | Ht 65.0 in | Wt 149.0 lb

## 2015-02-08 DIAGNOSIS — I10 Essential (primary) hypertension: Secondary | ICD-10-CM | POA: Diagnosis not present

## 2015-02-08 DIAGNOSIS — R7309 Other abnormal glucose: Secondary | ICD-10-CM

## 2015-02-08 DIAGNOSIS — R3129 Other microscopic hematuria: Secondary | ICD-10-CM

## 2015-02-08 DIAGNOSIS — N362 Urethral caruncle: Secondary | ICD-10-CM | POA: Diagnosis not present

## 2015-02-08 DIAGNOSIS — M316 Other giant cell arteritis: Secondary | ICD-10-CM

## 2015-02-08 DIAGNOSIS — R7303 Prediabetes: Secondary | ICD-10-CM

## 2015-02-08 DIAGNOSIS — R312 Other microscopic hematuria: Secondary | ICD-10-CM | POA: Diagnosis not present

## 2015-02-08 DIAGNOSIS — R319 Hematuria, unspecified: Secondary | ICD-10-CM | POA: Diagnosis not present

## 2015-02-08 DIAGNOSIS — M353 Polymyalgia rheumatica: Secondary | ICD-10-CM

## 2015-02-08 DIAGNOSIS — L719 Rosacea, unspecified: Secondary | ICD-10-CM | POA: Diagnosis not present

## 2015-02-08 LAB — POCT GLYCOSYLATED HEMOGLOBIN (HGB A1C): HEMOGLOBIN A1C: 5.9

## 2015-02-08 MED ORDER — ESTRADIOL 0.1 MG/GM VA CREA: 1.0000 | TOPICAL_CREAM | Freq: Every day | VAGINAL | Status: DC

## 2015-02-08 MED ORDER — DOXYCYCLINE HYCLATE 100 MG PO CAPS: 100.0000 mg | ORAL_CAPSULE | Freq: Every day | ORAL | Status: DC

## 2015-02-08 NOTE — Progress Notes (Signed)
Subjective:    Patient ID: Lindsey Vaughan, female    DOB: [birth year], 74 y.o.   MRN: [id number]  Hyperglycemia This is a recurrent problem. The current episode started more than 1 year ago. The problem has been gradually improving. Pertinent negatives include no abdominal pain, chest pain, fatigue, headaches, numbness or visual change. Nothing aggravates the symptoms.  Patient reports that s he has been working on healthy lifestyle. Prior ov 10/05/2014 labs checked. A1C 6.3%.   Rosacea Patient is requesting a refill on her doxycycline.   Did not go back to urology. Wants to have estrace renewed.   Did have ulcer last year. Has had recent stomach trouble again.  Has gallbladder sludge and referral to GI pending. Also, has start sulcrafate, and pantoprazole.   Review of Systems  Constitutional: Negative.  Negative for fatigue.  HENT: Negative.   Respiratory: Negative.   Cardiovascular: Negative.  Negative for chest pain.  Gastrointestinal: Negative for abdominal pain.  Endocrine: Negative.   Neurological: Negative for numbness and headaches.  Psychiatric/Behavioral: Negative.    Review of patient's allergies indicates no known allergies.  Current Outpatient Prescriptions on File Prior to Visit  Medication Sig Dispense Refill  . b complex vitamins tablet Take 1 tablet by mouth daily.      . Calcium Carbonate-Vit D-Min (CALCIUM/VITAMIN D/MINERALS) 600-200 MG-UNIT TABS Take 1 tablet by mouth daily.      . cycloSPORINE (RESTASIS) 0.05 % ophthalmic emulsion Apply 1 drop to eye daily. In each eye    . doxycycline (VIBRAMYCIN) 100 MG capsule Take 100 mg by mouth daily.    Marland Kitchen escitalopram (LEXAPRO) 10 MG tablet Take 10 mg by mouth daily.      . propranolol (INDERAL) 20 MG tablet Take 1 tablet (20 mg total) by mouth 3 (three) times daily as needed. 90 tablet 6  . simvastatin (ZOCOR) 20 MG tablet Take 1 tablet by mouth daily.     No current facility-administered medications on file prior  to visit.      Past Medical History  Diagnosis Date  . Hypercholesterolemia   . Anxiety   . Mitral valve insufficiency and aortic valve insufficiency     shown on echo 2006 in [place]  . GERD (gastroesophageal reflux disease)   . Hiatal hernia   . Cataract   . Polymyalgia rheumatica   . Partial deafness     L ear  . Meningitis spinal     9 months old   . Squamous cell carcinoma 2010    upper back   . Glucose intolerance (impaired glucose tolerance)   . Palpitations   . Cataract 2014    left eye   Past Surgical History  Procedure Laterality Date  . Hysterectomy - prolapsed cervix  1989  . Colonoscopy  2009  . Cataract extraction    . Upper gi endoscopy    . Abdominal hysterectomy  1989  . Cataract extraction Right 12/2011   Family History  Problem Relation Age of Onset  . Alcohol abuse Father   . CAD Father   . CAD Mother   . Congestive Heart Failure Mother   . Hypertension Mother   . Arthritis Sister   . Hypertension Sister   . Stroke Sister     2  . Arthritis Brother   . Hypertension Brother       reports that she has never smoked. She has never used smokeless tobacco. She reports that she drinks about 1.2 oz of alcohol per  week. She reports that she does not use illicit drugs. family history includes Alcohol abuse in her father; Arthritis in her brother and sister; CAD in her father and mother; Congestive Heart Failure in her mother; Hypertension in her brother, mother, and sister; Stroke in her sister. No Known Allergies    Blood pressure 120/80, pulse 76, temperature 97.8 F (36.6 C), temperature source Oral, resp. rate 16, height 5\' 5"  (1.651 m), weight 149 lb (67.586 kg).  Objective:   Physical Exam  Constitutional: She is oriented to person, place, and time. She appears well-developed and well-nourished.  Cardiovascular: Normal rate and regular rhythm.   Pulmonary/Chest: Effort normal and breath sounds normal.  Neurological: She is alert and oriented  to person, place, and time.  Psychiatric: She has a normal mood and affect. Her behavior is normal. Judgment and thought content normal.     BP 120/80 mmHg  Pulse 76  Temp(Src) 97.8 F (36.6 C) (Oral)  Resp 16  Ht 5\' 5"  (1.651 m)  Wt 149 lb (67.586 kg)  BMI 24.79 kg/m2         Assessment & Plan:    1. Borderline diabetes Stable at 5.9. Continue current lifestyle.   - POCT glycosylated hemoglobin (Hb A1C)  2. Acne erythematosa Stable. Continue current medication.  Referral to dermatology if worsens.  - doxycycline (VIBRAMYCIN) 100 MG capsule; Take 1 capsule (100 mg total) by mouth daily.  Dispense: 90 capsule; Refill: 1  3. Hematuria Related to urethral polyp.  Will continue current medication.  Would like meds filed thru our office and not return to urology unless something changes.  - estradiol (ESTRACE) 0.1 MG/GM vaginal cream; Place 1 Applicatorful vaginally at bedtime. Use pea sized application  to urethra at bedtime.  Dispense: 42.5 g; Refill: 12  4. Anarthritic rheumatoid disease  History of PMR.  Currently stable.    5. Essential hypertension  Condition is stable. Please continue current medication and  plan of care as noted.

## 2015-02-12 ENCOUNTER — Encounter: Payer: Self-pay | Admitting: Family Medicine

## 2015-02-12 DIAGNOSIS — K828 Other specified diseases of gallbladder: Secondary | ICD-10-CM | POA: Insufficient documentation

## 2015-02-19 ENCOUNTER — Encounter: Payer: Self-pay | Admitting: *Deleted

## 2015-02-20 ENCOUNTER — Ambulatory Visit: Payer: Medicare Other | Admitting: Anesthesiology

## 2015-02-20 ENCOUNTER — Encounter
Admission: RE | Disposition: A | Discharge status: Home / Self Care | Payer: Self-pay | Source: Ambulatory Visit | Attending: Gastroenterology

## 2015-02-20 ENCOUNTER — Ambulatory Visit
Admission: RE | Admit: 2015-02-20 | Discharge: 2015-02-20 | Disposition: A | Discharge status: Home / Self Care | Payer: Medicare Other | Source: Ambulatory Visit | Attending: Gastroenterology | Admitting: Gastroenterology

## 2015-02-20 DIAGNOSIS — K449 Diaphragmatic hernia without obstruction or gangrene: Secondary | ICD-10-CM | POA: Diagnosis not present

## 2015-02-20 DIAGNOSIS — I340 Nonrheumatic mitral (valve) insufficiency: Secondary | ICD-10-CM | POA: Insufficient documentation

## 2015-02-20 DIAGNOSIS — Z7982 Long term (current) use of aspirin: Secondary | ICD-10-CM | POA: Diagnosis not present

## 2015-02-20 DIAGNOSIS — Z79899 Other long term (current) drug therapy: Secondary | ICD-10-CM | POA: Insufficient documentation

## 2015-02-20 DIAGNOSIS — K2950 Unspecified chronic gastritis without bleeding: Secondary | ICD-10-CM | POA: Diagnosis not present

## 2015-02-20 DIAGNOSIS — H9192 Unspecified hearing loss, left ear: Secondary | ICD-10-CM | POA: Insufficient documentation

## 2015-02-20 DIAGNOSIS — K269 Duodenal ulcer, unspecified as acute or chronic, without hemorrhage or perforation: Secondary | ICD-10-CM | POA: Diagnosis present

## 2015-02-20 DIAGNOSIS — I10 Essential (primary) hypertension: Secondary | ICD-10-CM | POA: Insufficient documentation

## 2015-02-20 DIAGNOSIS — K210 Gastro-esophageal reflux disease with esophagitis: Secondary | ICD-10-CM | POA: Insufficient documentation

## 2015-02-20 DIAGNOSIS — K2970 Gastritis, unspecified, without bleeding: Secondary | ICD-10-CM | POA: Diagnosis not present

## 2015-02-20 DIAGNOSIS — E780 Pure hypercholesterolemia: Secondary | ICD-10-CM | POA: Diagnosis not present

## 2015-02-20 DIAGNOSIS — K2980 Duodenitis without bleeding: Secondary | ICD-10-CM | POA: Diagnosis not present

## 2015-02-20 DIAGNOSIS — M353 Polymyalgia rheumatica: Secondary | ICD-10-CM | POA: Diagnosis not present

## 2015-02-20 DIAGNOSIS — Z85828 Personal history of other malignant neoplasm of skin: Secondary | ICD-10-CM | POA: Insufficient documentation

## 2015-02-20 DIAGNOSIS — E7410 Disorder of fructose metabolism, unspecified: Secondary | ICD-10-CM | POA: Diagnosis not present

## 2015-02-20 DIAGNOSIS — R1013 Epigastric pain: Secondary | ICD-10-CM | POA: Diagnosis present

## 2015-02-20 DIAGNOSIS — K3189 Other diseases of stomach and duodenum: Secondary | ICD-10-CM | POA: Diagnosis not present

## 2015-02-20 HISTORY — PX: ESOPHAGOGASTRODUODENOSCOPY: SHX5428

## 2015-02-20 SURGERY — EGD (ESOPHAGOGASTRODUODENOSCOPY)
Anesthesia: General

## 2015-02-20 MED ORDER — PROPOFOL INFUSION 10 MG/ML OPTIME
INTRAVENOUS | Status: DC | PRN
  Administered 2015-02-20: 100 ug/kg/min via INTRAVENOUS

## 2015-02-20 MED ORDER — SODIUM CHLORIDE 0.9 % IV SOLN
INTRAVENOUS | Status: DC
  Administered 2015-02-20: 1000 mL via INTRAVENOUS

## 2015-02-20 MED ORDER — SODIUM CHLORIDE 0.9 % IV SOLN: INTRAVENOUS | Status: DC

## 2015-02-20 NOTE — Transfer of Care (Signed)
Immediate Anesthesia Transfer of Care Note  Patient: [name]  Procedure(s) Performed: Procedure(s): ESOPHAGOGASTRODUODENOSCOPY (EGD) (N/A)  Patient Location: PACU  Anesthesia Type:General  Level of Consciousness: awake and alert   Airway & Oxygen Therapy: Patient Spontanous Breathing and Patient connected to nasal cannula oxygen  Post-op Assessment: Report given to RN  Post vital signs: stable  Last Vitals:  Filed Vitals:   02/20/15 0858  BP: 124/61  Pulse:   Temp:   Resp:     Complications: No apparent anesthesia complications

## 2015-02-20 NOTE — H&P (Signed)
Outpatient short stay form Pre-procedure 02/20/2015 9:49 AM Lindsey Sails MD  Primary Physician: Dr. Margarita Rana  Reason for visit:  EGD  History of present illness:  Patient is a 74 year old female who is presenting today for an EGD in regards to her history of adrenal ulcer. I asked had an EGD on 03/24/2014 finding of a duodenal ulcer. He seemed to get better for some time however does have increased over the past couple of months felt that her original ulcer was NSAID related. He has been placed on some Carafate as an outpatient which has improved her symptoms.  However she is also had a ultrasound and HIDA scan showing gallbladder sludge and delayed gallbladder function. EGD today is essentially presurgery.  Does take a daily PPI as well.    Current facility-administered medications:  .  0.9 %  sodium chloride infusion, , Intravenous, Continuous, Lindsey Sails, MD, Last Rate: 50 mL/hr at 02/20/15 0905, 1,000 mL at 02/20/15 0905 .  0.9 %  sodium chloride infusion, , Intravenous, Continuous, Lindsey Sails, MD  Prescriptions prior to admission  Medication Sig Dispense Refill Last Dose  . aspirin EC 81 MG tablet Take 81 mg by mouth daily.     . propranolol (INDERAL) 20 MG tablet Take 1 tablet (20 mg total) by mouth 3 (three) times daily as needed. 90 tablet 6 02/20/2015 at 0530  . b complex vitamins tablet Take 1 tablet by mouth daily.     Taking  . Calcium Carbonate-Vit D-Min (CALCIUM/VITAMIN D/MINERALS) 600-200 MG-UNIT TABS Take 1 tablet by mouth daily.     Taking  . cycloSPORINE (RESTASIS) 0.05 % ophthalmic emulsion Apply 1 drop to eye daily. In each eye     . doxycycline (VIBRAMYCIN) 100 MG capsule Take 1 capsule (100 mg total) by mouth daily. 90 capsule 1   . escitalopram (LEXAPRO) 10 MG tablet Take 10 mg by mouth daily.     Taking  . estradiol (ESTRACE) 0.1 MG/GM vaginal cream Place 1 Applicatorful vaginally at bedtime. Use pea sized application  to urethra at bedtime.  42.5 g 12   . pantoprazole (PROTONIX) 40 MG tablet TK 1 T PO QD 1 HOUR BEFORE A MEAL  9   . Probiotic Product (PROBIOTIC & ACIDOPHILUS EX ST) CAPS Take by mouth.     . simvastatin (ZOCOR) 20 MG tablet Take 1 tablet by mouth daily.     . sucralfate (CARAFATE) 1 G tablet Take by mouth.        No Known Allergies   Past Medical History  Diagnosis Date  . Hypercholesterolemia   . Anxiety   . Mitral valve insufficiency and aortic valve insufficiency     shown on echo 2006 in [place]  . GERD (gastroesophageal reflux disease)   . Hiatal hernia   . Cataract   . Polymyalgia rheumatica   . Partial deafness     L ear  . Meningitis spinal     9 months old   . Squamous cell carcinoma 2010    upper back   . Glucose intolerance (impaired glucose tolerance)   . Palpitations   . Cataract 2014    left eye  . Hypertension     Review of systems:      Physical Exam    Heart and lungs: Regular rate and rhythm without rub or gallop lungs are bilaterally clear    HEENT: Normocephalic atraumatic eyes are anicteric    Other:     Pertinant exam for  procedure: Soft nontender nondistended bowel sounds positive normoactive    Planned proceedures: EGD and indicated procedures I have discussed the risks benefits and complications of procedures to include not limited to bleeding, infection, perforation and the risk of sedation and the patient wishes to proceed.    Lindsey Sails, MD Gastroenterology 02/20/2015  9:49 AM

## 2015-02-20 NOTE — Op Note (Signed)
[facility] Gastroenterology Patient Name: Lindsey Vaughan Procedure Date: 02/20/2015 9:43 AM MRN: [id number] Account #: [id number] Date of Birth: [birth year] Admit Type: Outpatient Age: 74 Room: [facility] ENDO ROOM 3 Gender: Female Note Status: Finalized Procedure:         Upper GI endoscopy Indications:       Dyspepsia, Follow-up of duodenal ulcer Providers:         Lollie Sails, MD Referring MD:      Jerrell Belfast, MD (Referring MD) Medicines:         Monitored Anesthesia Care Complications:     No immediate complications. Procedure:         Pre-Anesthesia Assessment:                    - ASA Grade Assessment: [ASA Grade].                    After obtaining informed consent, the endoscope was passed                     under direct vision. Throughout the procedure, the                     patient's blood pressure, pulse, and oxygen saturations                     were monitored continuously. The Olympus GIF-160 endoscope                     (S#. [id number]) was introduced through the mouth, and                     advanced to the third part of duodenum. The upper GI                     endoscopy was accomplished without difficulty. The patient                     tolerated the procedure well. Findings:      A small sliding hiatus hernia was present.      The Z-line was irregular. Biopsies were taken with a cold forceps for       histology.      The exam of the esophagus was otherwise normal.      Localized mild inflammation characterized by erythema was found on the       posterior wall of the gastric antrum. Biopsies were taken with a cold       forceps for histology. Biopsies were taken with a cold forceps for       Helicobacter pylori testing. Biopsies were taken with a cold forceps for       histology.      The exam of the stomach was otherwise normal.      Localized nodular mucosa was found in the duodenal bulb. Biopsies were       taken with a cold  forceps for histology.      The exam of the duodenum was otherwise normal.      The cardia and gastric fundus were normal on retroflexion otherwise. Impression:        - Small hiatus hernia.                    - Z-line irregular. Biopsied.                    -  Erosive gastritis. Biopsied.                    - Nodular mucosa in the duodenal bulb. Biopsied. Recommendation:    - Discharge patient to home.                    - Continue present medications.                    - Await pathology results.                    - Return to GI clinic in 4 weeks. Procedure Code(s): --- Professional ---                    [phone], Esophagogastroduodenoscopy, flexible, transoral;                     with biopsy, single or multiple Diagnosis Code(s): --- Professional ---                    530.89, Other specified disorders of esophagus                    535.40, Other specified gastritis, without mention of                     hemorrhage                    537.9, Unspecified disorder of stomach and duodenum                    536.8, Dyspepsia and other specified disorders of function                     of stomach                    532.90, Duodenal ulcer, unspecified as acute or chronic,                     without hemorrhage or perforation, without mention of                     obstruction                    553.3, Diaphragmatic hernia without mention of obstruction                     or gangrene CPT copyright 2014 American Medical Association. All rights reserved. The codes documented in this report are preliminary and upon coder review may  be revised to meet current compliance requirements. Lollie Sails, MD 02/20/2015 10:20:08 AM This report has been signed electronically. Number of Addenda: 0 Note Initiated On: 02/20/2015 9:43 AM      [facility]

## 2015-02-20 NOTE — Anesthesia Postprocedure Evaluation (Signed)
  Anesthesia Post-op Note  Patient: Lindsey Vaughan  Procedure(s) Performed: Procedure(s): ESOPHAGOGASTRODUODENOSCOPY (EGD) (N/A)  Anesthesia type:General  Patient location: PACU  Post pain: Pain level controlled  Post assessment: Post-op Vital signs reviewed, Patient's Cardiovascular Status Stable, Respiratory Function Stable, Patent Airway and No signs of Nausea or vomiting  Post vital signs: Reviewed and stable  Last Vitals:  Filed Vitals:   02/20/15 1050  BP: 113/61  Pulse: 63  Temp:   Resp: 13    Level of consciousness: awake, alert  and patient cooperative  Complications: No apparent anesthesia complications

## 2015-02-20 NOTE — Anesthesia Preprocedure Evaluation (Addendum)
Anesthesia Evaluation  Patient identified by MRN, date of birth, ID band Patient awake    Reviewed: Allergy & Precautions, NPO status , Patient's Chart, lab work & pertinent test results, reviewed documented beta blocker date and time   History of Anesthesia Complications Negative for: history of anesthetic complications  Airway Mallampati: II  TM Distance: >3 FB Neck ROM: Full    Dental  (+) Partial Upper   Pulmonary neg pulmonary ROS,  breath sounds clear to auscultation  Pulmonary exam normal       Cardiovascular hypertension, Pt. on medications and Pt. on home beta blockers + Peripheral Vascular Disease Normal cardiovascular exam+ Valvular Problems/Murmurs AI Rhythm:Regular Rate:Normal     Neuro/Psych Anxiety negative neurological ROS     GI/Hepatic Neg liver ROS, hiatal hernia, GERD-  Medicated and Controlled,  Endo/Other  negative endocrine ROS  Renal/GU negative Renal ROS  negative genitourinary   Musculoskeletal negative musculoskeletal ROS (+)   Abdominal   Peds negative pediatric ROS (+)  Hematology negative hematology ROS (+)   Anesthesia Other Findings   Reproductive/Obstetrics negative OB ROS                            Anesthesia Physical Anesthesia Plan  ASA: III  Anesthesia Plan: General   Post-op Pain Management:    Induction: Intravenous  Airway Management Planned: Nasal Cannula  Additional Equipment:   Intra-op Plan:   Post-operative Plan:   Informed Consent: I have reviewed the patients History and Physical, chart, labs and discussed the procedure including the risks, benefits and alternatives for the proposed anesthesia with the patient or authorized representative who has indicated his/her understanding and acceptance.     Plan Discussed with: CRNA and Surgeon  Anesthesia Plan Comments:         Anesthesia Quick Evaluation

## 2015-02-21 ENCOUNTER — Encounter: Payer: Self-pay | Admitting: Gastroenterology

## 2015-02-21 LAB — SURGICAL PATHOLOGY

## 2015-05-24 ENCOUNTER — Other Ambulatory Visit: Payer: Self-pay | Admitting: Family Medicine

## 2015-05-24 DIAGNOSIS — F411 Generalized anxiety disorder: Secondary | ICD-10-CM

## 2015-08-14 ENCOUNTER — Other Ambulatory Visit: Payer: Self-pay | Admitting: Family Medicine

## 2015-08-14 DIAGNOSIS — E7800 Pure hypercholesterolemia, unspecified: Secondary | ICD-10-CM

## 2015-09-14 ENCOUNTER — Ambulatory Visit (INDEPENDENT_AMBULATORY_CARE_PROVIDER_SITE_OTHER): Payer: Medicare Other | Admitting: Physician Assistant

## 2015-09-14 ENCOUNTER — Encounter: Payer: Self-pay | Admitting: Physician Assistant

## 2015-09-14 VITALS — BP 130/70 | HR 81 | Temp 98.1°F | Resp 16 | Wt 151.6 lb

## 2015-09-14 DIAGNOSIS — R5383 Other fatigue: Secondary | ICD-10-CM | POA: Diagnosis not present

## 2015-09-14 DIAGNOSIS — E7800 Pure hypercholesterolemia, unspecified: Secondary | ICD-10-CM

## 2015-09-14 DIAGNOSIS — R238 Other skin changes: Secondary | ICD-10-CM | POA: Diagnosis not present

## 2015-09-14 DIAGNOSIS — R7303 Prediabetes: Secondary | ICD-10-CM | POA: Diagnosis not present

## 2015-09-14 DIAGNOSIS — I10 Essential (primary) hypertension: Secondary | ICD-10-CM | POA: Diagnosis not present

## 2015-09-14 DIAGNOSIS — R233 Spontaneous ecchymoses: Secondary | ICD-10-CM

## 2015-09-14 NOTE — Progress Notes (Signed)
Patient: Lindsey [facility]an Female    DOB: 1940-11-17   75 y.o.   MRN: [id number] Visit Date: 09/14/2015  Today's Provider: Mar Daring, PA-C   Chief Complaint  Patient presents with  . Bleeding/Bruising    Bruising on arm   Subjective:    HPI  Lindsey Vaughan is here today concern about bruising easily on her arms. Per patient is happening occasional for the past 3 months. Patient notice another bruise on her right forearm, per patient it doesn't hurt. Per patient is still very active with the swimming and doesn't recall hurting herself in any way.  She states this is the 4th bruise since the Holidays. They always occur only on her forearms bilaterally.      No Known Allergies Previous Medications   ASPIRIN EC 81 MG TABLET    Take 81 mg by mouth daily.   B COMPLEX VITAMINS TABLET    Take 1 tablet by mouth daily.     CALCIUM CARBONATE-VIT D-MIN (CALCIUM/VITAMIN D/MINERALS) 600-200 MG-UNIT TABS    Take 1 tablet by mouth daily.     CYCLOSPORINE (RESTASIS) 0.05 % OPHTHALMIC EMULSION    Apply 1 drop to eye daily. In each eye   DOXYCYCLINE (VIBRAMYCIN) 100 MG CAPSULE    Take 1 capsule (100 mg total) by mouth daily.   ESCITALOPRAM (LEXAPRO) 10 MG TABLET    TAKE 1 TABLET BY MOUTH DAILY   ESTRADIOL (ESTRACE) 0.1 MG/GM VAGINAL CREAM    Place 1 Applicatorful vaginally at bedtime. Use pea sized application  to urethra at bedtime.   PANTOPRAZOLE (PROTONIX) 40 MG TABLET    TK 1 T PO QD 1 HOUR BEFORE A MEAL   PROBIOTIC PRODUCT (PROBIOTIC & ACIDOPHILUS EX ST) CAPS    Take by mouth.   PROPRANOLOL (INDERAL) 20 MG TABLET    Take 1 tablet (20 mg total) by mouth 3 (three) times daily as needed.   SIMVASTATIN (ZOCOR) 20 MG TABLET    TAKE 1 TABLET BY MOUTH AT BEDTIME FOR CHOLESTEROL   SUCRALFATE (CARAFATE) 1 G TABLET    Take by mouth.    Review of Systems  Constitutional: Positive for fatigue (occasional).  HENT: Negative.   Respiratory: Negative.   Cardiovascular: Negative.     Gastrointestinal: Negative.   Genitourinary: Negative.   Musculoskeletal: Negative.   Skin: Negative.   Neurological: Negative.   Hematological: Bruises/bleeds easily.    Social History  Substance Use Topics  . Smoking status: Never Smoker   . Smokeless tobacco: Never Used     Comment: tobacco use - no   . Alcohol Use: 1.2 oz/week    2 Glasses of wine per week     Comment: 2 glasses of wine on weekend    Objective:   BP 130/70 mmHg  Pulse 81  Temp(Src) 98.1 F (36.7 C) (Oral)  Resp 16  Wt 151 lb 9.6 oz (68.765 kg)  Physical Exam  Constitutional: She appears well-developed and well-nourished. No distress.  HENT:  Head: Normocephalic and atraumatic.  Eyes: Pupils are equal, round, and reactive to light.  Pale conjunctiva bilaterally  Cardiovascular: Normal rate, regular rhythm and normal heart sounds.  Exam reveals no gallop and no friction rub.   No murmur heard. Pulmonary/Chest: Effort normal and breath sounds normal. No respiratory distress. She has no wheezes. She has no rales.  Skin: She is not diaphoretic.  Vitals reviewed.       Assessment & Plan:  1. Borderline diabetes Will check labs as below.  She has been controlling with diet and exercise.  I will follow up with her pending labs.  She will have her CPE in March 2017. - HgB A1c  2. Hypercholesteremia Controlled with simvastatin 20mg , diet and exercise. Will check labs as below and f/u pending labs.  - Lipid panel  3. Essential hypertension Well-controlled on propranolol 20mg .  Will check labs as below and f/u pending lab results. She does also see Dr. George Ina for annual checks for palpitations. - Comprehensive Metabolic Panel (CMET)  4. Bruises easily New onset over last 3 months.  Does have history of gastric ulcer.  Was placed on protonix and sucralfate.  Most recent EGD summer 2016 was normal. No stomach pain, no bowel changes, no melena, no hematochezia, no gross hematuria. Will check labs as  below and f/u pending results. - CBC With Differential - IBC panel - Ferritin - Comprehensive Metabolic Panel (CMET)  5. Other fatigue Will check labs as new onset fatigue over the last 3 months.  Will f/u pending lab results. - TSH       Mar Daring, PA-C  [facility]

## 2015-09-14 NOTE — Patient Instructions (Signed)
Anemia, Nonspecific Anemia is a condition in which the concentration of red blood cells or hemoglobin in the blood is below normal. Hemoglobin is a substance in red blood cells that carries oxygen to the tissues of the body. Anemia results in not enough oxygen reaching these tissues.  CAUSES  Common causes of anemia include:   Excessive bleeding. Bleeding may be internal or external. This includes excessive bleeding from periods (in women) or from the intestine.   Poor nutrition.   Chronic kidney, thyroid, and liver disease.  Bone marrow disorders that decrease red blood cell production.  Cancer and treatments for cancer.  HIV, AIDS, and their treatments.  Spleen problems that increase red blood cell destruction.  Blood disorders.  Excess destruction of red blood cells due to infection, medicines, and autoimmune disorders. SIGNS AND SYMPTOMS   Minor weakness.   Dizziness.   Headache.  Palpitations.   Shortness of breath, especially with exercise.   Paleness.  Cold sensitivity.  Indigestion.  Nausea.  Difficulty sleeping.  Difficulty concentrating. Symptoms may occur suddenly or they may develop slowly.  DIAGNOSIS  Additional blood tests are often needed. These help your health care provider determine the best treatment. Your health care provider will check your stool for blood and look for other causes of blood loss.  TREATMENT  Treatment varies depending on the cause of the anemia. Treatment can include:   Supplements of iron, vitamin B12, or folic acid.   Hormone medicines.   A blood transfusion. This may be needed if blood loss is severe.   Hospitalization. This may be needed if there is significant continual blood loss.   Dietary changes.  Spleen removal. HOME CARE INSTRUCTIONS Keep all follow-up appointments. It often takes many weeks to correct anemia, and having your health care provider check on your condition and your response to  treatment is very important. SEEK IMMEDIATE MEDICAL CARE IF:   You develop extreme weakness, shortness of breath, or chest pain.   You become dizzy or have trouble concentrating.  You develop heavy vaginal bleeding.   You develop a rash.   You have bloody or black, tarry stools.   You faint.   You vomit up blood.   You vomit repeatedly.   You have abdominal pain.  You have a fever or persistent symptoms for more than 2-3 days.   You have a fever and your symptoms suddenly get worse.   You are dehydrated.  MAKE SURE YOU:  Understand these instructions.  Will watch your condition.  Will get help right away if you are not doing well or get worse.   This information is not intended to replace advice given to you by your health care provider. Make sure you discuss any questions you have with your health care provider.   Document Released: 09/25/2004 Document Revised: 04/20/2013 Document Reviewed: 02/11/2013 Elsevier Interactive Patient Education 2016 Elsevier Inc.  

## 2015-09-15 LAB — COMPREHENSIVE METABOLIC PANEL
ALT: 14 IU/L (ref 0–32)
AST: 18 IU/L (ref 0–40)
Albumin/Globulin Ratio: 1.7 (ref 1.1–2.5)
Albumin: 4.1 g/dL (ref 3.5–4.8)
Alkaline Phosphatase: 62 IU/L (ref 39–117)
BUN / CREAT RATIO: 20 (ref 11–26)
BUN: 16 mg/dL (ref 8–27)
Bilirubin Total: 0.4 mg/dL (ref 0.0–1.2)
CO2: 25 mmol/L (ref 18–29)
CREATININE: 0.79 mg/dL (ref 0.57–1.00)
Calcium: 9.2 mg/dL (ref 8.7–10.3)
Chloride: 101 mmol/L (ref 96–106)
GFR calc Af Amer: 85 mL/min/{1.73_m2} (ref >59)
GFR calc non Af Amer: 73 mL/min/{1.73_m2} (ref >59)
GLUCOSE: 110 mg/dL — AB (ref 65–99)
Globulin, Total: 2.4 g/dL (ref 1.5–4.5)
Potassium: 4.4 mmol/L (ref 3.5–5.2)
SODIUM: 142 mmol/L (ref 134–144)
Total Protein: 6.5 g/dL (ref 6.0–8.5)

## 2015-09-15 LAB — CBC WITH DIFFERENTIAL
Basophils Absolute: 0 10*3/uL (ref 0.0–0.2)
Basos: 1 %
EOS (ABSOLUTE): 0.1 10*3/uL (ref 0.0–0.4)
Eos: 2 %
Hematocrit: 39.4 % (ref 34.0–46.6)
Hemoglobin: 13.5 g/dL (ref 11.1–15.9)
IMMATURE GRANS (ABS): 0 10*3/uL (ref 0.0–0.1)
Immature Granulocytes: 0 %
LYMPHS: 38 %
Lymphocytes Absolute: 1.8 10*3/uL (ref 0.7–3.1)
MCH: 31 pg (ref 26.6–33.0)
MCHC: 34.3 g/dL (ref 31.5–35.7)
MCV: 91 fL (ref 79–97)
Monocytes Absolute: 0.4 10*3/uL (ref 0.1–0.9)
Monocytes: 9 %
NEUTROS PCT: 50 %
Neutrophils Absolute: 2.3 10*3/uL (ref 1.4–7.0)
RBC: 4.35 x10E6/uL (ref 3.77–5.28)
RDW: 13 % (ref 12.3–15.4)
WBC: 4.6 10*3/uL (ref 3.4–10.8)

## 2015-09-15 LAB — LIPID PANEL
CHOL/HDL RATIO: 2.6 ratio (ref 0.0–4.4)
Cholesterol, Total: 169 mg/dL (ref 100–199)
HDL: 64 mg/dL (ref >39)
LDL Calculated: 84 mg/dL (ref 0–99)
Triglycerides: 106 mg/dL (ref 0–149)
VLDL Cholesterol Cal: 21 mg/dL (ref 5–40)

## 2015-09-15 LAB — IRON AND TIBC
IRON SATURATION: 35 % (ref 15–55)
Iron: 90 ug/dL (ref 27–139)
Total Iron Binding Capacity: 255 ug/dL (ref 250–450)
UIBC: 165 ug/dL (ref 118–369)

## 2015-09-15 LAB — HEMOGLOBIN A1C
Est. average glucose Bld gHb Est-mCnc: 126 mg/dL
HEMOGLOBIN A1C: 6 % — AB (ref 4.8–5.6)

## 2015-09-15 LAB — TSH: TSH: 2.1 u[IU]/mL (ref 0.450–4.500)

## 2015-09-15 LAB — FERRITIN: Ferritin: 108 ng/mL (ref 15–150)

## 2015-09-17 ENCOUNTER — Telehealth: Payer: Self-pay

## 2015-09-17 NOTE — Telephone Encounter (Signed)
LMTCB  Thanks,  -Joseline 

## 2015-09-17 NOTE — Telephone Encounter (Signed)
-----   Message from Mar Daring, [place] sent at 09/17/2015 12:30 PM EST ----- All labs are stable and WNL. There is no anemia, iron is ok, platelets ok.  I am not sure what is causing bruising.  Could be secondary to ASA use.  You could do a trial without ASA and see if bruising subsides.

## 2015-09-17 NOTE — Telephone Encounter (Signed)
Patient advised as directed below.  Thanks,  -Joseline 

## 2015-09-21 ENCOUNTER — Ambulatory Visit: Payer: Medicare Other | Admitting: Physician Assistant

## 2015-09-30 LAB — SPECIMEN STATUS REPORT

## 2015-09-30 LAB — PLATELET COUNT: PLATELETS: 185 10*3/uL (ref 150–379)

## 2015-11-05 ENCOUNTER — Ambulatory Visit (INDEPENDENT_AMBULATORY_CARE_PROVIDER_SITE_OTHER): Payer: Medicare Other | Admitting: Family Medicine

## 2015-11-05 ENCOUNTER — Encounter: Payer: Self-pay | Admitting: Family Medicine

## 2015-11-05 VITALS — BP 100/66 | HR 72 | Temp 97.7°F | Resp 16 | Ht 65.0 in | Wt 151.0 lb

## 2015-11-05 DIAGNOSIS — Z0000 Encounter for general adult medical examination without abnormal findings: Secondary | ICD-10-CM

## 2015-11-05 DIAGNOSIS — Z780 Asymptomatic menopausal state: Secondary | ICD-10-CM

## 2015-11-05 DIAGNOSIS — R319 Hematuria, unspecified: Secondary | ICD-10-CM | POA: Diagnosis not present

## 2015-11-05 DIAGNOSIS — Z1239 Encounter for other screening for malignant neoplasm of breast: Secondary | ICD-10-CM | POA: Diagnosis not present

## 2015-11-05 DIAGNOSIS — Z23 Encounter for immunization: Secondary | ICD-10-CM

## 2015-11-05 MED ORDER — ESTRADIOL 0.1 MG/GM VA CREA: 1.0000 | TOPICAL_CREAM | Freq: Every day | VAGINAL | Status: DC

## 2015-11-05 NOTE — Progress Notes (Signed)
Patient ID: Lindsey Vaughan, female   DOB: 13-Oct-1940, 75 y.o.   MRN: [id number]       Patient: Lindsey [facility]an, Female    DOB: 03-Feb-1941, 75 y.o.   MRN: [id number] Visit Date: 11/05/2015  Today's Provider: Margarita Rana, MD   Chief Complaint  Patient presents with  . Medicare Wellness   Subjective:    Annual wellness visit Lindsey Vaughan is a 75 y.o. female. She feels well. She reports exercising 5 days a wek. She reports she is sleeping well. 10/05/14 CPE 10/17/14 Mammogram-BI-RADS 1 03/22/09 Colonoscopy-normal, recheck 5 yrs 06/24/12 BMD-osteoporosis  Lab Results  Component Value Date   WBC 4.6 09/14/2015   HGB 13.6 10/06/2014   HCT 39.4 09/14/2015   PLT 185 09/14/2015   GLUCOSE 110* 09/14/2015   CHOL 169 09/14/2015   TRIG 106 09/14/2015   HDL 64 09/14/2015   LDLCALC 84 09/14/2015   ALT 14 09/14/2015   AST 18 09/14/2015   NA 142 09/14/2015   K 4.4 09/14/2015   CL 101 09/14/2015   CREATININE 0.79 09/14/2015   BUN 16 09/14/2015   CO2 25 09/14/2015   TSH 2.100 09/14/2015   HGBA1C 6.0* 09/14/2015    -----------------------------------------------------------   Review of Systems  Constitutional: Negative.   HENT: Positive for postnasal drip.   Eyes: Negative.   Respiratory: Negative.   Cardiovascular: Positive for palpitations.  Gastrointestinal: Positive for constipation.  Endocrine: Negative.   Genitourinary: Negative.   Musculoskeletal: Positive for back pain, arthralgias and neck stiffness.  Skin: Negative.   Allergic/Immunologic: Negative.   Neurological: Negative.   Hematological: Negative.   Psychiatric/Behavioral: Negative.     Social History   Social History  . Marital Status: Married    Spouse Name: N/A  . Number of Children: N/A  . Years of Education: N/A   Occupational History  . Not on file.   Social History Main Topics  . Smoking status: Never Smoker   . Smokeless tobacco: Never Used     Comment: tobacco use - no   .  Alcohol Use: 1.2 oz/week    2 Glasses of wine per week     Comment: 2 glasses of wine on weekend   . Drug Use: No  . Sexual Activity: Not on file   Other Topics Concern  . Not on file   Social History Narrative   Married; retired; gets regular exercise - swims twice a week, walks 4 times a week.     Past Medical History  Diagnosis Date  . Hypercholesterolemia   . Anxiety   . Mitral valve insufficiency and aortic valve insufficiency     shown on echo 2006 in [place]  . GERD (gastroesophageal reflux disease)   . Hiatal hernia   . Cataract   . Polymyalgia rheumatica ([place])   . Partial deafness     L ear  . Meningitis spinal     9 months old   . Squamous cell carcinoma ([place]) 2010    upper back   . Glucose intolerance (impaired glucose tolerance)   . Palpitations   . Cataract 2014    left eye  . Hypertension      Patient Active Problem List   Diagnosis Date Noted  . Gallbladder sludge 02/12/2015  . Hematuria 02/08/2015  . Aorta disorder ([place]) 12/22/2014  . Arthritis 12/22/2014  . Acid reflux 12/22/2014  . Borderline diabetes 12/22/2014  . History of abdominal hernia 12/22/2014  . Inflammation of membranes covering brain and  spinal cord 12/22/2014  . OP (osteoporosis) 12/22/2014  . Acne erythematosa 12/22/2014  . Lesion of right sciatic nerve 12/22/2014  . CA of skin 12/22/2014  . Avitaminosis D 12/22/2014  . Preop cardiovascular exam 07/26/2013  . Urethral caruncle 07/11/2013  . Hematuria, microscopic 07/04/2013  . Hypertension 05/24/2012  . Hyperlipidemia 04/26/2010  . AORTIC & MITRAL STENOSIS W/ INSUFFI, RHEUM/NON-RHEUM 04/26/2010  . MITRAL REGURG W/ AORTIC INSUFF, RHEUM/NON-RHEUM 04/26/2010  . Palpitations 04/26/2010  . Hypercholesteremia 10/22/2009  . Mitral and aortic incompetence 10/18/2009  . Abnormal blood sugar 10/04/2009  . AG (atrophic gastritis) 10/04/2009  . Internal hemorrhoids without complication [id number]  . 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 Anxiety state 06/14/2009    Past Surgical History  Procedure Laterality Date  . Hysterectomy - prolapsed cervix  1989  . Colonoscopy  2009  . Cataract extraction    . Upper gi endoscopy    . Abdominal hysterectomy  1989  . Cataract extraction Right 12/2011  . Esophagogastroduodenoscopy N/A 02/20/2015    Procedure: ESOPHAGOGASTRODUODENOSCOPY (EGD);  Surgeon: Lollie Sails, MD;  [facility];  Service: Endoscopy;  Laterality: N/A;    Her family history includes Alcohol abuse in her father; Arthritis in her brother and sister; CAD in her father and mother; Congestive Heart Failure in her mother; Hypertension in her brother, mother, and sister; Stroke in her sister.    Previous Medications   B COMPLEX VITAMINS TABLET    Take 1 tablet by mouth daily.     CALCIUM CARBONATE-VIT D-MIN (CALCIUM/VITAMIN D/MINERALS) 600-200 MG-UNIT TABS    Take 1 tablet by mouth daily.     CYCLOSPORINE (RESTASIS) 0.05 % OPHTHALMIC EMULSION    Apply 1 drop to eye daily. In each eye   ESCITALOPRAM (LEXAPRO) 10 MG TABLET    TAKE 1 TABLET BY MOUTH DAILY   ESTRADIOL (ESTRACE) 0.1 MG/GM VAGINAL CREAM    Place 1 Applicatorful vaginally at bedtime. Use pea sized application  to urethra at bedtime.   PANTOPRAZOLE (PROTONIX) 40 MG TABLET    TK 1 T PO QD 1 HOUR BEFORE A MEAL   PROBIOTIC PRODUCT (PROBIOTIC & ACIDOPHILUS EX ST) CAPS    Take by mouth.   PROPRANOLOL (INDERAL) 20 MG TABLET    Take 1 tablet (20 mg total) by mouth 3 (three) times daily as needed.   SIMVASTATIN (ZOCOR) 20 MG TABLET    TAKE 1 TABLET BY MOUTH AT BEDTIME FOR CHOLESTEROL   SUCRALFATE (CARAFATE) 1 G TABLET    Take 1 g by mouth 2 (two) times daily.     Patient Care Team: Margarita Rana, MD as PCP - General (Family Medicine) Minna Merritts, MD as Consulting Physician (Cardiology)     Objective:   Vitals: BP 100/66 mmHg  Pulse 72  Temp(Src) 97.7 F (36.5 C) (Oral)  Resp 16  Ht 5\' 5"  (1.651 m)  Wt 151 lb (68.493  kg)  BMI 25.13 kg/m2  SpO2 97%  Physical Exam  Constitutional: She is oriented to person, place, and time. She appears well-developed and well-nourished.  HENT:  Head: Normocephalic and atraumatic.  Right Ear: Tympanic membrane, external ear and ear canal normal.  Left Ear: Tympanic membrane, external ear and ear canal normal.  Nose: Nose normal.  Mouth/Throat: Uvula is midline, oropharynx is clear and moist and mucous membranes are normal.  Eyes: Conjunctivae, EOM and lids are normal. Pupils are equal, round, and reactive to light.  Neck: Trachea normal and normal range of motion.  Neck supple. Carotid bruit is not present. No thyroid mass and no thyromegaly present.  Cardiovascular: Normal rate, regular rhythm and normal heart sounds.   Pulmonary/Chest: Effort normal and breath sounds normal.  Abdominal: Soft. Normal appearance and bowel sounds are normal. There is no hepatosplenomegaly. There is no tenderness.  Musculoskeletal: Normal range of motion.  Lymphadenopathy:    She has no cervical adenopathy.    She has no axillary adenopathy.  Neurological: She is alert and oriented to person, place, and time. She has normal strength. No cranial nerve deficit.  Skin: Skin is warm, dry and intact.  Psychiatric: She has a normal mood and affect. Her speech is normal and behavior is normal. Judgment and thought content normal. Cognition and memory are normal.    Activities of Daily Living In your present state of health, do you have any difficulty performing the following activities: 11/05/2015 02/08/2015  Hearing? Tempie Donning  Vision? N N  Difficulty concentrating or making decisions? N N  Walking or climbing stairs? N N  Dressing or bathing? N N  Doing errands, shopping? N N    Fall Risk Assessment Fall Risk  11/05/2015 02/08/2015  Falls in the past year? No No     Depression Screen PHQ 2/9 Scores 11/05/2015 02/08/2015  PHQ - 2 Score 0 0    Cognitive Testing - 6-CIT  Correct? Score   What  year is it? yes 0 0 or 4  What month is it? yes 0 0 or 3  Memorize:    Pia Mau,  41,  [address]  [place],      What time is it? (within 1 hour) yes 0 0 or 3  Count backwards from 20 yes 0 0, 2, or 4  Name the months of the year yes 0 0, 2, or 4  Repeat name & address above no 1 0, 2, 4, 6, 8, or 10       TOTAL SCORE  1/28   Interpretation:  Normal  Normal (0-7) Abnormal (8-28)       Assessment & Plan:     Annual Wellness Visit  Reviewed patient's Family Medical History Reviewed and updated list of patient's medical providers Assessment of cognitive impairment was done Assessed patient's functional ability Established a written schedule for health screening [place] Completed and Reviewed  Exercise Activities and Dietary recommendations Goals    None      Immunization History  Administered Date(s) Administered  . Influenza-Unspecified 06/11/2015  . Pneumococcal Conjugate-13 10/05/2014  . Pneumococcal Polysaccharide-23 11/05/2015  . Tdap 05/21/2011  . Zoster 04/17/2010        1. Medicare annual wellness visit, subsequent Stable. Patient advised to continue eating healthy and exercise daily.  2. Need for pneumococcal vaccination - Pneumococcal polysaccharide vaccine 23-valent greater than or equal to 2yo subcutaneous/IM  3. Breast cancer screening - MM DIGITAL SCREENING BILATERAL; Future  4. Postmenopausal - DG Bone Density; Future  5. Hematuria F/U pending lab report. - estradiol (ESTRACE) 0.1 MG/GM vaginal cream; Place 1 Applicatorful vaginally at bedtime. Use pea sized application  to urethra at bedtime.  Dispense: 42.5 g; Refill: 12 - Urine Microscopic   Patient seen and examined by Dr. Jerrell Belfast, and note scribed by Philbert Riser. Dim[place], CMA.  I have reviewed the document for accuracy and completeness and I agree with above. Jerrell Belfast, MD   Margarita Rana, MD     ------------------------------------------------------------------------------------------------------------

## 2015-11-06 ENCOUNTER — Ambulatory Visit (INDEPENDENT_AMBULATORY_CARE_PROVIDER_SITE_OTHER): Payer: Medicare Other | Admitting: Cardiovascular Disease

## 2015-11-06 ENCOUNTER — Encounter: Payer: Self-pay | Admitting: Cardiovascular Disease

## 2015-11-06 VITALS — BP 124/62 | HR 76 | Ht 65.0 in | Wt 152.0 lb

## 2015-11-06 DIAGNOSIS — R002 Palpitations: Secondary | ICD-10-CM

## 2015-11-06 DIAGNOSIS — I38 Endocarditis, valve unspecified: Secondary | ICD-10-CM

## 2015-11-06 DIAGNOSIS — I10 Essential (primary) hypertension: Secondary | ICD-10-CM

## 2015-11-06 DIAGNOSIS — E7800 Pure hypercholesterolemia, unspecified: Secondary | ICD-10-CM

## 2015-11-06 DIAGNOSIS — E785 Hyperlipidemia, unspecified: Secondary | ICD-10-CM | POA: Diagnosis not present

## 2015-11-06 LAB — URINALYSIS, MICROSCOPIC ONLY
Bacteria, UA: NONE SEEN
Casts: NONE SEEN /lpf

## 2015-11-06 NOTE — Progress Notes (Signed)
Patient ID: Lindsey Vaughan, female    DOB: 10-18-1940, 75 y.o.   MRN: [id number]  HPI Comments: Lindsey Vaughan is a very pleasant 75 year-old woman with a long history of palpitations, polymyalgia rheumatica, anxiety, hyperlipidemia who presents for routine followup for her aortic and mitral valve insufficiency previously seen on echocardiogram 8 or 9 years ago  and palpitations.  In follow-up today, she reports that she has been doing well, feels great Exercises regularly Denies any symptoms of shortness of breath or chest pain, no leg edema Denies having any significant tachycardia concerning for arrhythmia Blood pressure well controlled Recent total cholesterol 160s on simvastatin Hemoglobin A1c of 6 in the past no murmur appreciated,   previous history of valve regurgitation was not confirmed with echo given negative clinical exam  EKG on today's visit shows normal sinus rhythm with rate 76 bpm, no significant ST or T-wave changes     No Known Allergies  Outpatient Encounter Prescriptions as of 11/06/2015  Medication Sig  . b complex vitamins tablet Take 1 tablet by mouth daily.    . Calcium Carbonate-Vit D-Min (CALCIUM/VITAMIN D/MINERALS) 600-200 MG-UNIT TABS Take 1 tablet by mouth daily.    . cycloSPORINE (RESTASIS) 0.05 % ophthalmic emulsion Apply 1 drop to eye daily. In each eye  . escitalopram (LEXAPRO) 10 MG tablet TAKE 1 TABLET BY MOUTH DAILY  . estradiol (ESTRACE) 0.1 MG/GM vaginal cream Place 1 Applicatorful vaginally at bedtime. Use pea sized application  to urethra at bedtime.  . pantoprazole (PROTONIX) 40 MG tablet TK 1 T PO QD 1 HOUR BEFORE A MEAL  . Probiotic Product (PROBIOTIC & ACIDOPHILUS EX ST) CAPS Take by mouth.  . propranolol (INDERAL) 20 MG tablet Take 1 tablet (20 mg total) by mouth 3 (three) times daily as needed. (Patient taking differently: Take 20 mg by mouth at bedtime. )  . simvastatin (ZOCOR) 20 MG tablet TAKE 1 TABLET BY MOUTH AT BEDTIME FOR CHOLESTEROL   . sucralfate (CARAFATE) 1 G tablet Take 1 g by mouth 2 (two) times daily.    No facility-administered encounter medications on file as of 11/06/2015.    Past Medical History  Diagnosis Date  . Hypercholesterolemia   . Anxiety   . Mitral valve insufficiency and aortic valve insufficiency     shown on echo 2006 in [place]  . GERD (gastroesophageal reflux disease)   . Hiatal hernia   . Cataract   . Polymyalgia rheumatica ([facility])   . Partial deafness     L ear  . Meningitis spinal     9 months old   . Squamous cell carcinoma ([facility]) 2010    upper back   . Glucose intolerance (impaired glucose tolerance)   . Palpitations   . Cataract 2014    left eye  . Hypertension     Past Surgical History  Procedure Laterality Date  . Hysterectomy - prolapsed cervix  1989  . Colonoscopy  2009  . Cataract extraction    . Upper gi endoscopy    . Abdominal hysterectomy  1989  . Cataract extraction Right 12/2011  . Esophagogastroduodenoscopy N/A 02/20/2015    Procedure: ESOPHAGOGASTRODUODENOSCOPY (EGD);  Surgeon: Lollie Sails, MD;  Location: [facility];  Service: Endoscopy;  Laterality: N/A;    Social History  reports that she has never smoked. She has never used smokeless tobacco. She reports that she drinks about 1.2 oz of alcohol per week. She reports that she does not use illicit drugs.  Family History  family history includes Alcohol abuse in her father; Arthritis in her brother and sister; CAD in her father and mother; Congestive Heart Failure in her mother; Hypertension in her brother, mother, and sister; Stroke in her sister.    Review of Systems  Constitutional: Negative.   Respiratory: Negative.   Cardiovascular: Negative.   Gastrointestinal: Negative.   Endocrine: Negative.   Musculoskeletal: Negative.   Neurological: Negative.   Hematological: Negative.   Psychiatric/Behavioral: Negative.   All other systems reviewed and are negative.   BP 124/62 mmHg  Pulse 76  Ht  5\' 5"  (1.651 m)  Wt 152 lb (68.947 kg)  BMI 25.29 kg/m2  Physical Exam  Constitutional: She is oriented to person, place, and time. She appears well-developed and well-nourished.  HENT:  Head: Normocephalic.  Nose: Nose normal.  Mouth/Throat: Oropharynx is clear and moist.  Eyes: Conjunctivae are normal. Pupils are equal, round, and reactive to light.  Neck: Normal range of motion. Neck supple. No JVD present.  Cardiovascular: Normal rate, regular rhythm, S1 normal, S2 normal, normal heart sounds and intact distal pulses.  Exam reveals no gallop and no friction rub.   No murmur heard. Pulmonary/Chest: Effort normal and breath sounds normal. No respiratory distress. She has no wheezes. She has no rales. She exhibits no tenderness.  Abdominal: Soft. Bowel sounds are normal. She exhibits no distension. There is no tenderness.  Musculoskeletal: Normal range of motion. She exhibits no edema or tenderness.  Lymphadenopathy:    She has no cervical adenopathy.  Neurological: She is alert and oriented to person, place, and time. Coordination normal.  Skin: Skin is warm and dry. No rash noted. No erythema.  Psychiatric: She has a normal mood and affect. Her behavior is normal. Judgment and thought content normal.    Assessment and Plan  Nursing note and vitals reviewed.

## 2015-11-06 NOTE — Assessment & Plan Note (Signed)
No significant murmur appreciated on exam No clinical signs of valve disorder Likely had mild valve disease years ago that has not progressed No strong indication for repeat echocardiogram as she is doing well Discuss this with her, she feels comfortable on not ordering any testing

## 2015-11-06 NOTE — Assessment & Plan Note (Signed)
Blood pressure is well controlled on today's visit. No changes made to the medications. 

## 2015-11-06 NOTE — Patient Instructions (Signed)
You are doing well. No medication changes were made.  Please call us if you have new issues that need to be addressed before your next appt.  Your physician wants you to follow-up in: 12 months.  You will receive a reminder letter in the mail two months in advance. If you don't receive a letter, please call our office to schedule the follow-up appointment. 

## 2015-11-06 NOTE — Assessment & Plan Note (Signed)
Cholesterol is at goal on the current lipid regimen. No changes to the medications were made.  

## 2015-11-08 ENCOUNTER — Other Ambulatory Visit: Payer: Self-pay | Admitting: Cardiovascular Disease

## 2015-11-09 ENCOUNTER — Telehealth: Payer: Self-pay | Admitting: Family Medicine

## 2015-11-09 NOTE — Telephone Encounter (Signed)
Printed out immunization record for patient. Placed up front for pick up.

## 2015-11-09 NOTE — Telephone Encounter (Signed)
Pt calling requesting to get a copy of any shots she has gotten here for a part-time job, pt would like a return call when ready to be picked up. Thanks CC

## 2015-11-21 ENCOUNTER — Ambulatory Visit
Admission: RE | Admit: 2015-11-21 | Discharge: 2015-11-21 | Disposition: A | Discharge status: Home / Self Care | Payer: Medicare Other | Source: Ambulatory Visit | Attending: Family Medicine | Admitting: Family Medicine

## 2015-11-21 DIAGNOSIS — Z1231 Encounter for screening mammogram for malignant neoplasm of breast: Secondary | ICD-10-CM | POA: Diagnosis not present

## 2015-11-21 DIAGNOSIS — M85852 Other specified disorders of bone density and structure, left thigh: Secondary | ICD-10-CM | POA: Insufficient documentation

## 2015-11-21 DIAGNOSIS — M8588 Other specified disorders of bone density and structure, other site: Secondary | ICD-10-CM | POA: Insufficient documentation

## 2015-11-21 DIAGNOSIS — Z1239 Encounter for other screening for malignant neoplasm of breast: Secondary | ICD-10-CM

## 2015-11-21 DIAGNOSIS — Z780 Asymptomatic menopausal state: Secondary | ICD-10-CM

## 2015-11-26 ENCOUNTER — Telehealth: Payer: Self-pay

## 2015-11-26 NOTE — Telephone Encounter (Signed)
-----   Message from Margarita Rana, MD sent at 11/23/2015 12:10 PM EDT ----- Bone density is unchanged from previous, but with increased age,  Is osteoporosis and 10 year risk of hip fracture is 14 percent and medication is recommended. OV if patient would like medication. Thanks.

## 2015-11-26 NOTE — Telephone Encounter (Signed)
Pt advised as directed below.  She does not want to start medication at this point.   Thanks,   -Mickel Baas

## 2015-11-29 ENCOUNTER — Other Ambulatory Visit: Payer: Self-pay | Admitting: Family Medicine

## 2015-11-29 DIAGNOSIS — F411 Generalized anxiety disorder: Secondary | ICD-10-CM

## 2015-11-29 NOTE — Telephone Encounter (Signed)
LOV 11/05/2015.

## 2016-02-13 DIAGNOSIS — L821 Other seborrheic keratosis: Secondary | ICD-10-CM | POA: Diagnosis not present

## 2016-02-13 DIAGNOSIS — L719 Rosacea, unspecified: Secondary | ICD-10-CM | POA: Diagnosis not present

## 2016-02-13 DIAGNOSIS — L820 Inflamed seborrheic keratosis: Secondary | ICD-10-CM | POA: Diagnosis not present

## 2016-02-13 DIAGNOSIS — L578 Other skin changes due to chronic exposure to nonionizing radiation: Secondary | ICD-10-CM | POA: Diagnosis not present

## 2016-02-19 ENCOUNTER — Ambulatory Visit: Payer: Medicare Other | Admitting: Internal Medicine

## 2016-03-01 ENCOUNTER — Other Ambulatory Visit: Payer: Self-pay | Admitting: Family Medicine

## 2016-03-01 DIAGNOSIS — E785 Hyperlipidemia, unspecified: Secondary | ICD-10-CM

## 2016-04-01 ENCOUNTER — Ambulatory Visit (INDEPENDENT_AMBULATORY_CARE_PROVIDER_SITE_OTHER): Payer: Medicare Other | Admitting: Family Medicine

## 2016-04-01 ENCOUNTER — Encounter: Payer: Self-pay | Admitting: Family Medicine

## 2016-04-01 VITALS — BP 120/60 | HR 65 | Temp 97.5°F | Resp 16 | Wt 151.0 lb

## 2016-04-01 DIAGNOSIS — H6122 Impacted cerumen, left ear: Secondary | ICD-10-CM | POA: Diagnosis not present

## 2016-04-01 DIAGNOSIS — M542 Cervicalgia: Secondary | ICD-10-CM | POA: Diagnosis not present

## 2016-04-01 DIAGNOSIS — H9311 Tinnitus, right ear: Secondary | ICD-10-CM | POA: Diagnosis not present

## 2016-04-01 NOTE — Progress Notes (Signed)
Patient: Lindsey Vaughan Female    DOB: [birth year]   75 y.o.   MRN: [id number] Visit Date: 04/01/2016  Today's Provider: Lelon Huh, MD   Chief Complaint  Patient presents with  . Ear Pain   Subjective:     This is a previous patient of Dr. Venia Minks present today as new patient to me to establish care and follow up on chronic medical problems.   HPI Ear pain: Patient presents stating she believes she has infection in her left ear. She reports intermittent pain and itching in her left ear for the past month. Patient denies and drainage from her ear. Patient has tried taking Benadryl which has helped improve symptoms. Pain and itching are worse when laying down. She states she can hear her heart beat in her right ear.     No Known Allergies Current Meds  Medication Sig  . b complex vitamins tablet Take 1 tablet by mouth daily.    . Calcium Carbonate-Vit D-Min (CALCIUM/VITAMIN D/MINERALS) 600-200 MG-UNIT TABS Take 1 tablet by mouth daily.    . cycloSPORINE (RESTASIS) 0.05 % ophthalmic emulsion Apply 1 drop to eye daily. In each eye  . escitalopram (LEXAPRO) 10 MG tablet TAKE 1 TABLET BY MOUTH DAILY  . estradiol (ESTRACE) 0.1 MG/GM vaginal cream Place 1 Applicatorful vaginally at bedtime. Use pea sized application  to urethra at bedtime.  . pantoprazole (PROTONIX) 40 MG tablet TK 1 T PO QD 1 HOUR BEFORE A MEAL  . Probiotic Product (PROBIOTIC & ACIDOPHILUS EX ST) CAPS Take by mouth.  . propranolol (INDERAL) 20 MG tablet TAKE 1 TABLET BY MOUTH THREE TIMES DAILY AS NEEDED  . simvastatin (ZOCOR) 20 MG tablet TAKE 1 TABLET BY MOUTH AT BEDTIME FOR CHOLESTEROL    Review of Systems  Constitutional: Negative for appetite change, chills, fatigue and fever.  HENT: Positive for ear pain (left ear) and postnasal drip. Negative for congestion, dental problem, drooling, ear discharge, facial swelling, hearing loss, mouth sores, nosebleeds, rhinorrhea, sinus pressure, sneezing, sore throat,  tinnitus, trouble swallowing and voice change.   Respiratory: Negative for chest tightness and shortness of breath.   Cardiovascular: Negative for chest pain and palpitations.  Gastrointestinal: Negative for abdominal pain, nausea and vomiting.  Neurological: Positive for headaches. Negative for dizziness and weakness.    Social History  Substance Use Topics  . Smoking status: Never Smoker  . Smokeless tobacco: Never Used     Comment: tobacco use - no   . Alcohol use 1.2 oz/week    2 Glasses of wine per week     Comment: 2 glasses of wine on weekend    Objective:   BP 120/60 (BP Location: Left Arm, Patient Position: Sitting, Cuff Size: Large)   Pulse 65   Temp 97.5 F (36.4 C) (Oral)   Resp 16   Wt 151 lb (68.5 kg)   SpO2 95% Comment: room air  BMI 25.13 kg/m   Physical Exam  General Appearance:    Alert, cooperative, no distress  HENT:   right TM normal without fluid or infection, neck without nodes, throat normal without erythema or exudate and sinuses nontender Left TM obstructed by cerumen. Nasal septum deviated to left Left ear canal and TM clear after irrigation  Eyes:    PERRL, conjunctiva/corneas clear, EOM's intact       Lungs:     Clear to auscultation bilaterally, respirations unlabored  Heart:    Regular rate and rhythm  Neurologic:  Awake, alert, oriented x 3. No apparent focal neurological           defect.           Assessment & Plan:     1. Tinnitus aurium, right   2. Excessive cerumen in ear canal, left Cerumen impaction left ear  3. Neck pain At end visit patient requested referral to rheumatology. She states she was followed by rheumatologist prior to moving to [facility] and wants to be followed by local specialist. She has aches in the joints of her fingers every day which is getting worse, and pain on the left side of her neck and shoulders. She also has pain in both knees all the time and may need to get something done about them. Discusses  orthopedic evaluation, but she wants to go ahead and get established with rheumatology first.  - Ambulatory referral to Rheumatology     The entirety of the information documented in the History of Present Illness, Review of Systems and Physical Exam were personally obtained by me. Portions of this information were initially documented by Meyer Cory, CMA and reviewed by me for thoroughness and accuracy.    Lelon Huh, MD  [facility]

## 2016-04-02 ENCOUNTER — Ambulatory Visit: Payer: Medicare Other | Admitting: Family Medicine

## 2016-05-29 ENCOUNTER — Other Ambulatory Visit: Payer: Self-pay | Admitting: Family Medicine

## 2016-05-29 DIAGNOSIS — L719 Rosacea, unspecified: Secondary | ICD-10-CM

## 2016-06-27 DIAGNOSIS — K210 Gastro-esophageal reflux disease with esophagitis: Secondary | ICD-10-CM | POA: Diagnosis not present

## 2016-06-27 DIAGNOSIS — Z8719 Personal history of other diseases of the digestive system: Secondary | ICD-10-CM | POA: Diagnosis not present

## 2016-06-27 DIAGNOSIS — R1013 Epigastric pain: Secondary | ICD-10-CM | POA: Diagnosis not present

## 2016-07-05 ENCOUNTER — Ambulatory Visit (INDEPENDENT_AMBULATORY_CARE_PROVIDER_SITE_OTHER): Payer: Medicare Other

## 2016-07-05 DIAGNOSIS — Z23 Encounter for immunization: Secondary | ICD-10-CM | POA: Diagnosis not present

## 2016-08-06 ENCOUNTER — Ambulatory Visit (INDEPENDENT_AMBULATORY_CARE_PROVIDER_SITE_OTHER): Payer: Medicare Other | Admitting: Family Medicine

## 2016-08-06 ENCOUNTER — Encounter: Payer: Self-pay | Admitting: Family Medicine

## 2016-08-06 VITALS — BP 108/62 | HR 64 | Temp 97.9°F | Resp 16 | Wt 152.0 lb

## 2016-08-06 DIAGNOSIS — R590 Localized enlarged lymph nodes: Secondary | ICD-10-CM

## 2016-08-06 DIAGNOSIS — M2550 Pain in unspecified joint: Secondary | ICD-10-CM

## 2016-08-06 MED ORDER — AMOXICILLIN 500 MG PO CAPS: 1000.0000 mg | ORAL_CAPSULE | Freq: Two times a day (BID) | ORAL | 0 refills | Status: AC

## 2016-08-06 NOTE — Progress Notes (Signed)
Patient: Lindsey Vaughan Female    DOB: March 02, 1941   75 y.o.   MRN: [id number] Visit Date: 08/06/2016  Today's Provider: Lelon Huh, MD   Chief Complaint  Patient presents with  . Adenopathy    X 1 week.    Subjective:    HPI Patient presents today c/o enlarged lymph node on the left side of her neck. She reports that it has it for X 1 week. Patient reports that the size of the lymph node has decreased and is less tender the last few days, but it is still there.   She also reports diffuse arthralgias including fingers of both hands, lower back and knees which have been getting progressively worse the last several months. States tylenol provides minimal relief. She requests referral to rheumatology.      No Known Allergies   Current Outpatient Prescriptions:  .  b complex vitamins tablet, Take 1 tablet by mouth daily.  , Disp: , Rfl:  .  Calcium Carbonate-Vit D-Min (CALCIUM/VITAMIN D/MINERALS) 600-200 MG-UNIT TABS, Take 1 tablet by mouth daily.  , Disp: , Rfl:  .  cycloSPORINE (RESTASIS) 0.05 % ophthalmic emulsion, Apply 1 drop to eye daily. In each eye, Disp: , Rfl:  .  doxycycline (VIBRAMYCIN) 100 MG capsule, TAKE 1 CAPSULE(100 MG) BY MOUTH DAILY, Disp: 90 capsule, Rfl: 1 .  escitalopram (LEXAPRO) 10 MG tablet, TAKE 1 TABLET BY MOUTH DAILY, Disp: 90 tablet, Rfl: 3 .  estradiol (ESTRACE) 0.1 MG/GM vaginal cream, Place 1 Applicatorful vaginally at bedtime. Use pea sized application  to urethra at bedtime., Disp: 42.5 g, Rfl: 12 .  pantoprazole (PROTONIX) 40 MG tablet, TK 1 T PO QD 1 HOUR BEFORE A MEAL, Disp: , Rfl: 9 .  Probiotic Product (PROBIOTIC & ACIDOPHILUS EX ST) CAPS, Take by mouth., Disp: , Rfl:  .  propranolol (INDERAL) 20 MG tablet, TAKE 1 TABLET BY MOUTH THREE TIMES DAILY AS NEEDED, Disp: 270 tablet, Rfl: 3 .  simvastatin (ZOCOR) 20 MG tablet, TAKE 1 TABLET BY MOUTH AT BEDTIME FOR CHOLESTEROL, Disp: 90 tablet, Rfl: 3 .  sucralfate (CARAFATE) 1 G tablet, Take 1  g by mouth 2 (two) times daily. , Disp: , Rfl:   Review of Systems  Constitutional: Negative.   HENT: Positive for congestion and postnasal drip.   Respiratory: Negative.   Cardiovascular: Negative.   Neurological: Negative for headaches.    Social History  Substance Use Topics  . Smoking status: Never Smoker  . Smokeless tobacco: Never Used     Comment: tobacco use - no   . Alcohol use 1.2 oz/week    2 Glasses of wine per week     Comment: 2 glasses of wine on weekend    Objective:   BP 108/62 (BP Location: Left Arm, Patient Position: Sitting, Cuff Size: Normal)   Pulse 64   Temp 97.9 F (36.6 C)   Resp 16   Wt 152 lb (68.9 kg)   BMI 25.29 kg/m   Physical Exam  General Appearance:    Alert, cooperative, no distress  HENT:   bilateral TM normal without fluid or infection, throat normal without erythema or exudate, pharynx erythematous without exudate, sinuses nontender and nasal mucosa congested. About 1/2 cm slightly tender left submandibular lymph node palpated.   Eyes:    PERRL, conjunctiva/corneas clear, EOM's intact       Lungs:     Clear to auscultation bilaterally, respirations unlabored  Heart:  Regular rate and rhythm  Neurologic:   Awake, alert, oriented x 3. No apparent focal neurological           defect.           Assessment & Plan:     1. Lymphadenopathy, anterior cervical  - amoxicillin (AMOXIL) 500 MG capsule; Take 2 capsules (1,000 mg total) by mouth 2 (two) times daily.  Dispense: 40 capsule; Refill: 0 Call if symptoms change or if not rapidly improving.     2. Arthralgia, unspecified joint She prefers this to be evaluated and managed by rheumatology.  - Ambulatory referral to Pediatric Rheumatology  The entirety of the information documented in the History of Present Illness, Review of Systems and Physical Exam were personally obtained by me. Portions of this information were initially documented by Wilburt Finlay, CMA and reviewed by me  for thoroughness and accuracy.        Lelon Huh, MD  [facility]

## 2016-10-14 DIAGNOSIS — J301 Allergic rhinitis due to pollen: Secondary | ICD-10-CM | POA: Diagnosis not present

## 2016-10-14 DIAGNOSIS — H6980 Other specified disorders of Eustachian tube, unspecified ear: Secondary | ICD-10-CM | POA: Diagnosis not present

## 2016-10-14 DIAGNOSIS — H93A3 Pulsatile tinnitus, bilateral: Secondary | ICD-10-CM | POA: Diagnosis not present

## 2016-11-09 NOTE — Progress Notes (Signed)
Cardiology Office Note  Date:  11/10/2016   ID:  Lindsey Vaughan, Lindsey Vaughan [birth year], MRN [id number]  PCP:  Mar Daring, PA-C   Chief Complaint  Patient presents with  . OTHER    1 yr f/u no complaints today. Meds reviewed verbally with pt.    HPI:  Ms. Gierke is a very pleasant 76 year-old woman with a long history of palpitations, polymyalgia rheumatica, anxiety, hyperlipidemia who presents for routine followup for her aortic and mitral valve insufficiency previously seen on echocardiogram 8 or 9 years ago  and palpitations. No significant murmur appreciated on past visits  In follow-up today she reports that she is doing well with no significant palpitations or shortness of breath on exertion Recent Sinus congestion, better with nasal spray Exercises regularly, daily We reviewed lab work with her, total cholesterol 160 range, tolerating simvastatin 20 g daily Significant family history of coronary disease No significant leg edema Denies having any significant tachycardia concerning for arrhythmia Blood pressure well controlled total cholesterol 160s on simvastatin Hemoglobin A1c of 6   previous history of valve regurgitation was not confirmed with echo (unable to obtain previous echocardiogram)  EKG on today's visit shows normal sinus rhythm with rate 62 bpm, no significant ST or T-wave changes    PMH:   has a past medical history of Anxiety; Cataract; Cataract (2014); GERD (gastroesophageal reflux disease); Glucose intolerance (impaired glucose tolerance); Hiatal hernia; Hypercholesterolemia; Hypertension; Meningitis spinal; Mitral valve insufficiency and aortic valve insufficiency; Palpitations; Partial deafness; Polymyalgia rheumatica ([place]); and Squamous cell carcinoma (2010).  PSH:    Past Surgical History:  Procedure Laterality Date  . ABDOMINAL HYSTERECTOMY  1989  . CATARACT EXTRACTION    . CATARACT EXTRACTION Right 12/2011  . COLONOSCOPY  2009  .  ESOPHAGOGASTRODUODENOSCOPY N/A 02/20/2015   Procedure: ESOPHAGOGASTRODUODENOSCOPY (EGD);  Surgeon: Lollie Sails, MD;  Location: [facility] ENDOSCOPY;  Service: Endoscopy;  Laterality: N/A;  . hysterectomy - prolapsed cervix  1989  . UPPER GI ENDOSCOPY      Current Outpatient Prescriptions  Medication Sig Dispense Refill  . b complex vitamins tablet Take 1 tablet by mouth daily.      . Calcium Carbonate-Vit D-Min (CALCIUM/VITAMIN D/MINERALS) 600-200 MG-UNIT TABS Take 1 tablet by mouth daily.      . cycloSPORINE (RESTASIS) 0.05 % ophthalmic emulsion Apply 1 drop to eye daily. In each eye    . doxycycline (VIBRAMYCIN) 100 MG capsule TAKE 1 CAPSULE(100 MG) BY MOUTH DAILY 90 capsule 1  . escitalopram (LEXAPRO) 10 MG tablet TAKE 1 TABLET BY MOUTH DAILY 90 tablet 3  . estradiol (ESTRACE) 0.1 MG/GM vaginal cream Place 1 Applicatorful vaginally at bedtime. Use pea sized application  to urethra at bedtime. 42.5 g 12  . pantoprazole (PROTONIX) 40 MG tablet TK 1 T PO QD 1 HOUR BEFORE A MEAL  9  . Probiotic Product (PROBIOTIC & ACIDOPHILUS EX ST) CAPS Take by mouth.    . propranolol (INDERAL) 20 MG tablet TAKE 1 TABLET BY MOUTH THREE TIMES DAILY AS NEEDED 270 tablet 3  . simvastatin (ZOCOR) 20 MG tablet TAKE 1 TABLET BY MOUTH AT BEDTIME FOR CHOLESTEROL 90 tablet 3  . sucralfate (CARAFATE) 1 G tablet Take 1 g by mouth 2 (two) times daily.      No current facility-administered medications for this visit.      Allergies:   Patient has no known allergies.   Social History:  The patient  reports that she has never smoked. She has never  used smokeless tobacco. She reports that she drinks about 1.2 oz of alcohol per week . She reports that she does not use drugs.   Family History:   family history includes Alcohol abuse in her father; Arthritis in her brother and sister; CAD in her father and mother; Congestive Heart Failure in her mother; Hypertension in her brother, mother, and sister; Stroke in her sister.     Review of Systems: Review of Systems  Constitutional: Negative.   Respiratory: Negative.   Cardiovascular: Negative.   Gastrointestinal: Negative.   Musculoskeletal: Negative.   Neurological: Negative.   Psychiatric/Behavioral: Negative.   All other systems reviewed and are negative.    PHYSICAL EXAM: VS:  BP 120/64 (BP Location: Left Arm, Patient Position: Sitting, Cuff Size: Normal)   Pulse 62   Ht 5' 6.5" (1.689 m)   Wt 149 lb 4 oz (67.7 kg)   BMI 23.73 kg/m  , BMI Body mass index is 23.73 kg/m. GEN: Well nourished, well developed, in no acute distress  HEENT: normal  Neck: no JVD, carotid bruits, or masses Cardiac: RRR; no murmurs, rubs, or gallops,no edema  Respiratory:  clear to auscultation bilaterally, normal work of breathing GI: soft, nontender, nondistended, + BS MS: no deformity or atrophy  Skin: warm and dry, no rash Neuro:  Strength and sensation are intact Psych: euthymic mood, full affect    Recent Labs: No results found for requested labs within last 8760 hours.    Lipid Panel Lab Results  Component Value Date   CHOL 169 09/14/2015   HDL 64 09/14/2015   LDLCALC 84 09/14/2015   TRIG 106 09/14/2015      Wt Readings from Last 3 Encounters:  11/10/16 149 lb 4 oz (67.7 kg)  08/06/16 152 lb (68.9 kg)  04/01/16 151 lb (68.5 kg)       ASSESSMENT AND PLAN:  Mixed hyperlipidemia - Plan: EKG 12-Lead Cholesterol is at goal on the current lipid regimen. No changes to the medications were made.  AORTIC & MITRAL STENOSIS W/ INSUFFI, RHEUM/NON-RHEUM - Plan: EKG 12-Lead No murmur on exam Asymptomatic, no shortness of breath No further workup at this time  Essential hypertension - Plan: EKG 12-Lead Blood pressure is well controlled on today's visit. No changes made to the medications.  Palpitations Rare episodes, takes propranolol as needed   Total encounter time more than 15 minutes  Greater than 50% was spent in counseling and  coordination of care with the patient   Disposition:   F/U  12 months   Orders Placed This Encounter  Procedures  . EKG 12-Lead     Signed, Esmond Plants, M.D., Ph.D. 11/10/2016  [place], [place]

## 2016-11-10 ENCOUNTER — Ambulatory Visit (INDEPENDENT_AMBULATORY_CARE_PROVIDER_SITE_OTHER): Payer: Medicare Other | Admitting: Cardiovascular Disease

## 2016-11-10 ENCOUNTER — Encounter: Payer: Self-pay | Admitting: Cardiovascular Disease

## 2016-11-10 VITALS — BP 120/64 | HR 62 | Ht 66.5 in | Wt 149.2 lb

## 2016-11-10 DIAGNOSIS — E782 Mixed hyperlipidemia: Secondary | ICD-10-CM | POA: Diagnosis not present

## 2016-11-10 DIAGNOSIS — I080 Rheumatic disorders of both mitral and aortic valves: Secondary | ICD-10-CM | POA: Diagnosis not present

## 2016-11-10 DIAGNOSIS — I10 Essential (primary) hypertension: Secondary | ICD-10-CM | POA: Diagnosis not present

## 2016-11-10 NOTE — Patient Instructions (Signed)

## 2016-11-21 ENCOUNTER — Encounter: Payer: Self-pay | Admitting: Family Medicine

## 2016-11-21 ENCOUNTER — Ambulatory Visit (INDEPENDENT_AMBULATORY_CARE_PROVIDER_SITE_OTHER): Payer: Medicare Other | Admitting: Family Medicine

## 2016-11-21 VITALS — BP 116/62 | HR 73 | Temp 97.7°F | Resp 16 | Ht 65.75 in | Wt 148.0 lb

## 2016-11-21 DIAGNOSIS — K219 Gastro-esophageal reflux disease without esophagitis: Secondary | ICD-10-CM | POA: Diagnosis not present

## 2016-11-21 DIAGNOSIS — I10 Essential (primary) hypertension: Secondary | ICD-10-CM | POA: Diagnosis not present

## 2016-11-21 DIAGNOSIS — F411 Generalized anxiety disorder: Secondary | ICD-10-CM

## 2016-11-21 DIAGNOSIS — M25562 Pain in left knee: Secondary | ICD-10-CM | POA: Diagnosis not present

## 2016-11-21 DIAGNOSIS — E7800 Pure hypercholesterolemia, unspecified: Secondary | ICD-10-CM | POA: Diagnosis not present

## 2016-11-21 DIAGNOSIS — M1990 Unspecified osteoarthritis, unspecified site: Secondary | ICD-10-CM | POA: Diagnosis not present

## 2016-11-21 DIAGNOSIS — E559 Vitamin D deficiency, unspecified: Secondary | ICD-10-CM

## 2016-11-21 DIAGNOSIS — R7303 Prediabetes: Secondary | ICD-10-CM | POA: Diagnosis not present

## 2016-11-21 DIAGNOSIS — L719 Rosacea, unspecified: Secondary | ICD-10-CM

## 2016-11-21 DIAGNOSIS — Z0000 Encounter for general adult medical examination without abnormal findings: Secondary | ICD-10-CM

## 2016-11-21 MED ORDER — ESCITALOPRAM OXALATE 5 MG PO TABS: 5.0000 mg | ORAL_TABLET | Freq: Every day | ORAL | 3 refills | Status: DC

## 2016-11-21 NOTE — Progress Notes (Signed)
Patient: Lindsey Vaughan, Female    DOB: [birth year], 76 y.o.   MRN: [id number] Visit Date: 11/21/2016  Today's Provider: Lelon Huh, MD   Chief Complaint  Patient presents with  . Annual Exam  . Hypertension  . Hyperlipidemia  . Hyperglycemia   Subjective:    Annual physical [name] is a 76 y.o. female. She feels well. She reports exercising 3 times a week. She reports she is sleeping well.  -----------------------------------------------------------  Hypertension, follow-up:  BP Readings from Last 3 Encounters:  11/10/16 120/64  08/06/16 108/62  04/01/16 120/60    She was last seen for hypertension 1 weeks ago by Dr. Rockey Situ.  BP at that visit was 120/64. Management since that visit includes no changes. She reports good compliance with treatment. She is not having side effects.  She is exercising. She is not adherent to low salt diet.   Outside blood pressures are not being checked. She is experiencing none.  Patient denies chest pain, chest pressure/discomfort, claudication, dyspnea, exertional chest pressure/discomfort, fatigue, irregular heart beat, lower extremity edema, near-syncope, orthopnea, palpitations, paroxysmal nocturnal dyspnea, syncope and tachypnea.   Cardiovascular risk factors include advanced age (older than 81 for men, 37 for women) and hypertension.  Use of agents associated with hypertension: none.     Weight trend: stable Wt Readings from Last 3 Encounters:  11/10/16 149 lb 4 oz (67.7 kg)  08/06/16 152 lb (68.9 kg)  04/01/16 151 lb (68.5 kg)    Current diet: in general, a "healthy" diet    ------------------------------------------------------------------------  Lipid/Cholesterol, Follow-up:   Last seen for this1 years ago.  Management changes since that visit include none. . Last Lipid Panel:    Component Value Date/Time   CHOL 169 09/14/2015 1049   TRIG 106 09/14/2015 1049   HDL 64 09/14/2015 1049   CHOLHDL 2.6 09/14/2015 1049   LDLCALC 84 09/14/2015 1049    Risk factors for vascular disease include hypercholesterolemia and hypertension  She reports good compliance with treatment. She is not having side effects.  Current symptoms include none and have been stable. Weight trend: stable Prior visit with dietician: no Current diet: in general, a "healthy" diet   Current exercise: swimming  Wt Readings from Last 3 Encounters:  11/10/16 149 lb 4 oz (67.7 kg)  08/06/16 152 lb (68.9 kg)  04/01/16 151 lb (68.5 kg)    ------------------------------------------------------------------   Hyperglycemia, Follow-up:   Lab Results  Component Value Date   HGBA1C 6.0 (H) 09/14/2015   HGBA1C 5.9 02/08/2015   HGBA1C 6.3 (A) 10/06/2014   GLUCOSE 110 (H) 09/14/2015    Last seen for for this 1 years ago.  Management since then includes no changes. Controlled with diet and exercise. Current symptoms include none and have been stable.  Weight trend: stable Prior visit with dietician: no Current diet: in general, a "healthy" diet   Current exercise: swimming  Pertinent Labs:    Component Value Date/Time   CHOL 169 09/14/2015 1049   TRIG 106 09/14/2015 1049   CHOLHDL 2.6 09/14/2015 1049   CREATININE 0.79 09/14/2015 1049    Wt Readings from Last 3 Encounters:  11/10/16 149 lb 4 oz (67.7 kg)  08/06/16 152 lb (68.9 kg)  04/01/16 151 lb (68.5 kg)    Follow up Post Menopausal:   Patient was last seen for this problem 1 year ago. Management during that visit include ordering a BMD.   Review of Systems  Constitutional: Negative for  chills, fatigue and fever.  HENT: Negative for congestion, ear pain, rhinorrhea, sneezing and sore throat.   Eyes: Negative.  Negative for pain and redness.  Respiratory: Negative for cough, shortness of breath and wheezing.   Cardiovascular: Negative for chest pain and leg swelling.  Gastrointestinal: Negative for abdominal pain, blood in stool,  constipation, diarrhea and nausea.  Endocrine: Negative for polydipsia and polyphagia.  Genitourinary: Negative.  Negative for dysuria, flank pain, hematuria, pelvic pain, vaginal bleeding and vaginal discharge.  Musculoskeletal: Positive for arthralgias, joint swelling and neck pain. Negative for back pain and gait problem.  Skin: Negative for rash.  Neurological: Negative.  Negative for dizziness, tremors, seizures, weakness, light-headedness, numbness and headaches.  Hematological: Negative for adenopathy.  Psychiatric/Behavioral: Negative.  Negative for behavioral problems, confusion and dysphoric mood. The patient is not nervous/anxious and is not hyperactive.     Social History   Social History  . Marital status: Married    Spouse name: N/A  . Number of children: 2  . Years of education: N/A   Occupational History  . Retired    Social History Main Topics  . Smoking status: Never Smoker  . Smokeless tobacco: Never Used     Comment: tobacco use - no   . Alcohol use 1.2 oz/week    2 Glasses of wine per week     Comment: 2 glasses of wine on weekend   . Drug use: No  . Sexual activity: Not on file   Other Topics Concern  . Not on file   Social History Narrative   Married; retired; gets regular exercise - swims twice a week, walks 4 times a week.     Past Medical History:  Diagnosis Date  . Anxiety   . Cataract   . Cataract 2014   left eye  . GERD (gastroesophageal reflux disease)   . Glucose intolerance (impaired glucose tolerance)   . Hiatal hernia   . Hypercholesterolemia   . Hypertension   . Meningitis spinal    9 months old   . Mitral valve insufficiency and aortic valve insufficiency    shown on echo 2006 in [place]  . Palpitations   . Partial deafness    L ear  . Polymyalgia rheumatica ([place])   . Squamous cell carcinoma 2010   upper back      Patient Active Problem List   Diagnosis Date Noted  . Heart valve disorder 11/06/2015  . Gallbladder sludge  02/12/2015  . Hematuria 02/08/2015  . Aorta disorder ([place]) 12/22/2014  . Arthritis 12/22/2014  . Acid reflux 12/22/2014  . Borderline diabetes 12/22/2014  . History of abdominal hernia 12/22/2014  . Inflammation of membranes covering brain and spinal cord 12/22/2014  . OP (osteoporosis) 12/22/2014  . Acne erythematosa 12/22/2014  . Lesion of right sciatic nerve 12/22/2014  . CA of skin 12/22/2014  . Avitaminosis D 12/22/2014  . Preop cardiovascular exam 07/26/2013  . Urethral caruncle 07/11/2013  . Hematuria, microscopic 07/04/2013  . Hypertension 05/24/2012  . Hyperlipidemia 04/26/2010  . AORTIC & MITRAL STENOSIS W/ INSUFFI, RHEUM/NON-RHEUM 04/26/2010  . Palpitations 04/26/2010  . Hypercholesteremia 10/22/2009  . Abnormal blood sugar 10/04/2009  . AG (atrophic gastritis) 10/04/2009  . Internal hemorrhoids without complication 92/42/6834  . 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 Anxiety state 06/14/2009    Past Surgical History:  Procedure Laterality Date  . ABDOMINAL HYSTERECTOMY  1989  . CATARACT EXTRACTION    . CATARACT EXTRACTION Right 12/2011  . COLONOSCOPY  2009  . ESOPHAGOGASTRODUODENOSCOPY N/A 02/20/2015   Procedure: ESOPHAGOGASTRODUODENOSCOPY (EGD);  Surgeon: Lollie Sails, MD;  Location[facility] ENDOSCOPY;  Service: Endoscopy;  Laterality: N/A;  . hysterectomy - prolapsed cervix  1989  . UPPER GI ENDOSCOPY      Her family history includes Alcohol abuse in her father; Arthritis in her brother and sister; CAD in her father and mother; Congestive Heart Failure in her mother; Hypertension in her brother, mother, and sister; Stroke in her sister.      Current Outpatient Prescriptions:  .  b complex vitamins tablet, Take 1 tablet by mouth daily.  , Disp: , Rfl:  .  Calcium Carbonate-Vit D-Min (CALCIUM/VITAMIN D/MINERALS) 600-200 MG-UNIT TABS, Take 1 tablet by mouth daily.  , Disp: , Rfl:  .  cycloSPORINE (RESTASIS) 0.05 % ophthalmic emulsion, Apply 1 drop  to eye daily. In each eye, Disp: , Rfl:  .  doxycycline (VIBRAMYCIN) 100 MG capsule, TAKE 1 CAPSULE(100 MG) BY MOUTH DAILY, Disp: 90 capsule, Rfl: 1 .  escitalopram (LEXAPRO) 10 MG tablet, TAKE 1 TABLET BY MOUTH DAILY, Disp: 90 tablet, Rfl: 3 .  estradiol (ESTRACE) 0.1 MG/GM vaginal cream, Place 1 Applicatorful vaginally at bedtime. Use pea sized application  to urethra at bedtime., Disp: 42.5 g, Rfl: 12 .  pantoprazole (PROTONIX) 40 MG tablet, TK 1 T PO QD 1 HOUR BEFORE A MEAL, Disp: , Rfl: 9 .  Probiotic Product (PROBIOTIC & ACIDOPHILUS EX ST) CAPS, Take by mouth., Disp: , Rfl:  .  propranolol (INDERAL) 20 MG tablet, TAKE 1 TABLET BY MOUTH THREE TIMES DAILY AS NEEDED, Disp: 270 tablet, Rfl: 3 .  simvastatin (ZOCOR) 20 MG tablet, TAKE 1 TABLET BY MOUTH AT BEDTIME FOR CHOLESTEROL, Disp: 90 tablet, Rfl: 3 .  sucralfate (CARAFATE) 1 G tablet, Take 1 g by mouth 2 (two) times daily. , Disp: , Rfl:   Patient Care Team: Mar Daring, PA-C as PCP - General (Family Medicine) Minna Merritts, MD as Consulting Physician (Cardiology)     Objective:   Vitals: BP 116/62 (BP Location: Right Arm, Patient Position: Sitting, Cuff Size: Normal)   Pulse 73   Temp 97.7 F (36.5 C) (Oral)   Resp 16   Ht 5' 5.75" (1.67 m)   Wt 148 lb (67.1 kg)   SpO2 98% Comment: room air  BMI 24.07 kg/m   Physical Exam  Activities of Daily Living In your present state of health, do you have any difficulty performing the following activities: 11/21/2016  Hearing? Y  Vision? N  Difficulty concentrating or making decisions? N  Walking or climbing stairs? N  Dressing or bathing? N  Doing errands, shopping? N  Some recent data might be hidden    Fall Risk Assessment Fall Risk  11/21/2016 11/05/2015 02/08/2015  Falls in the past year? No No No     Depression Screen PHQ 2/9 Scores 11/21/2016 11/05/2015 02/08/2015  PHQ - 2 Score 0 0 0  PHQ- 9 Score 0 - -    Cognitive Testing - 6-CIT  Correct? Score   What year  is it? yes 0 0 or 4  What month is it? yes 0 0 or 3  Memorize:    Pia Mau,  41,  [place],      What time is it? (within 1 hour) yes 0 0 or 3  Count backwards from 20 yes 0 0, 2, or 4  Name the months of the year yes 0 0, 2, or  4  Repeat name & address above yes 0 0, 2, 4, 6, 8, or 10       TOTAL SCORE  0/28   Interpretation:  Normal  Normal (0-7) Abnormal (8-28)   Current Exercise Habits: Home exercise routine, Type of exercise: Other - see comments (swimming), Time (Minutes): 40, Frequency (Times/Week): 3, Weekly Exercise (Minutes/Week): 120, Intensity: Moderate Exercise limited by: None identified  Audit-C Alcohol Use Screening  Question Answer Points  How often do you have alcoholic drink? 1 or more times weekly 2  On days you do drink alcohol, how many drinks do you typically consume? 1 or more a week 0  How oftey will you drink 6 or more in a total? never 0  Total Score:  2   A score of 3 or more in women, and 4 or more in men indicates increased risk for alcohol abuse, EXCEPT if all of the points are from question 1.     Assessment & Plan:     Annual Wellness Visit  Reviewed patient's Family Medical History Reviewed and updated list of patient's medical providers Assessment of cognitive impairment was done Assessed patient's functional ability Established a written schedule for health screening [place] Completed and Reviewed  Exercise Activities and Dietary recommendations Goals    None      Immunization History  Administered Date(s) Administered  . Influenza, High Dose Seasonal PF 07/05/2016  . Influenza-Unspecified 06/11/2015  . Pneumococcal Conjugate-13 10/05/2014  . Pneumococcal Polysaccharide-23 11/05/2015  . Tdap 05/21/2011  . Zoster 04/17/2010    Health Maintenance  Topic Date Due  . Samul Dada  05/20/2021  . INFLUENZA VACCINE  Completed  . DEXA SCAN  Completed  . PNA vac Low Risk Adult  Completed      Discussed health benefits of physical activity, and encouraged her to engage in regular exercise appropriate for her age and condition.    ------------------------------------------------------------------------------------------------------------  1. Annual physical exam Generally doing well.   2. Essential hypertension Well controlled.  Continue current medications.    3.. Avitaminosis D  - VITAMIN D 25 Hydroxy (Vit-D Deficiency, Fractures)  4. Hypercholesteremia She is tolerating simvastatin well with no adverse effects.   - Lipid panel - Comprehensive metabolic panel  5. Gastroesophageal reflux disease, esophagitis presence not specified Doing well on pantoprazole  6. Pre-diabetes  - Hemoglobin A1c  7. Arthritis   8. Left knee pain, unspecified chronicity She requests referral to orthopedics for further evaluation and treatment.   11. Anxiety state She states she has been on lexapro for many years and isn't really sure that she needs to continue taking it. Advised her that long term SSRIs need to be weaned, so will start by reducing to 5mg  a day. She can call for further weaning instruction if doing well after a few weeks.  - escitalopram (LEXAPRO) 5 MG tablet; Take 1 tablet (5 mg total) by mouth daily.  Dispense: 90 tablet; Refill: 3  9. Acne erythematosa (eyelid) On chronic doxycycline for many years. She is going to discuss with Dr. George Ina if she can stop taking it.   The entirety of the information documented in the History of Present Illness, Review of Systems and Physical Exam were personally obtained by me. Portions of this information were initially documented by Meyer Cory, CMA and reviewed by me for thoroughness and accuracy.    Lelon Huh, MD  [facility]

## 2016-11-22 LAB — LIPID PANEL
CHOLESTEROL TOTAL: 193 mg/dL (ref 100–199)
Chol/HDL Ratio: 3.1 ratio units (ref 0.0–4.4)
HDL: 63 mg/dL (ref >39)
LDL Calculated: 104 mg/dL — ABNORMAL HIGH (ref 0–99)
TRIGLYCERIDES: 128 mg/dL (ref 0–149)
VLDL Cholesterol Cal: 26 mg/dL (ref 5–40)

## 2016-11-22 LAB — COMPREHENSIVE METABOLIC PANEL
A/G RATIO: 1.6 (ref 1.2–2.2)
ALBUMIN: 4.3 g/dL (ref 3.5–4.8)
ALT: 13 IU/L (ref 0–32)
AST: 16 IU/L (ref 0–40)
Alkaline Phosphatase: 69 IU/L (ref 39–117)
BILIRUBIN TOTAL: 0.4 mg/dL (ref 0.0–1.2)
BUN / CREAT RATIO: 19 (ref 12–28)
BUN: 17 mg/dL (ref 8–27)
CALCIUM: 9.5 mg/dL (ref 8.7–10.3)
CHLORIDE: 107 mmol/L — AB (ref 96–106)
CO2: 24 mmol/L (ref 18–29)
Creatinine, Ser: 0.89 mg/dL (ref 0.57–1.00)
GFR, EST AFRICAN AMERICAN: 73 mL/min/{1.73_m2} (ref >59)
GFR, EST NON AFRICAN AMERICAN: 63 mL/min/{1.73_m2} (ref >59)
GLOBULIN, TOTAL: 2.7 g/dL (ref 1.5–4.5)
Glucose: 117 mg/dL — ABNORMAL HIGH (ref 65–99)
POTASSIUM: 4.9 mmol/L (ref 3.5–5.2)
SODIUM: 150 mmol/L — AB (ref 134–144)
TOTAL PROTEIN: 7 g/dL (ref 6.0–8.5)

## 2016-11-22 LAB — HEMOGLOBIN A1C
Est. average glucose Bld gHb Est-mCnc: 128 mg/dL
Hgb A1c MFr Bld: 6.1 % — ABNORMAL HIGH (ref 4.8–5.6)

## 2016-11-22 LAB — VITAMIN D 25 HYDROXY (VIT D DEFICIENCY, FRACTURES): VIT D 25 HYDROXY: 49.4 ng/mL (ref 30.0–100.0)

## 2016-11-28 DIAGNOSIS — M5416 Radiculopathy, lumbar region: Secondary | ICD-10-CM | POA: Diagnosis not present

## 2016-11-28 DIAGNOSIS — M7542 Impingement syndrome of left shoulder: Secondary | ICD-10-CM | POA: Diagnosis not present

## 2016-11-28 DIAGNOSIS — M542 Cervicalgia: Secondary | ICD-10-CM | POA: Diagnosis not present

## 2016-12-19 DIAGNOSIS — H35371 Puckering of macula, right eye: Secondary | ICD-10-CM | POA: Diagnosis not present

## 2016-12-31 ENCOUNTER — Other Ambulatory Visit: Payer: Self-pay | Admitting: Physician Assistant

## 2016-12-31 MED ORDER — ESTRADIOL 0.1 MG/GM VA CREA: 1.0000 | TOPICAL_CREAM | Freq: Every day | VAGINAL | 12 refills | Status: AC

## 2016-12-31 NOTE — Telephone Encounter (Signed)
[facility] faxed a request on the following medication. Thanks CC  estradiol (ESTRACE) 0.1 MG/GM vaginal cream  Use pea sized application to urethra every night at bedtime.

## 2017-01-30 ENCOUNTER — Other Ambulatory Visit: Payer: Self-pay | Admitting: Family Medicine

## 2017-01-30 DIAGNOSIS — Z1231 Encounter for screening mammogram for malignant neoplasm of breast: Secondary | ICD-10-CM

## 2017-02-12 ENCOUNTER — Ambulatory Visit
Admission: RE | Admit: 2017-02-12 | Discharge: 2017-02-12 | Disposition: A | Discharge status: Home / Self Care | Payer: Medicare Other | Source: Ambulatory Visit | Attending: Family Medicine | Admitting: Family Medicine

## 2017-02-12 DIAGNOSIS — Z1231 Encounter for screening mammogram for malignant neoplasm of breast: Secondary | ICD-10-CM | POA: Insufficient documentation

## 2017-04-20 DIAGNOSIS — R200 Anesthesia of skin: Secondary | ICD-10-CM | POA: Diagnosis not present

## 2017-04-20 DIAGNOSIS — R202 Paresthesia of skin: Secondary | ICD-10-CM | POA: Diagnosis not present

## 2017-05-13 DIAGNOSIS — G629 Polyneuropathy, unspecified: Secondary | ICD-10-CM | POA: Diagnosis not present

## 2017-05-18 DIAGNOSIS — G629 Polyneuropathy, unspecified: Secondary | ICD-10-CM | POA: Insufficient documentation

## 2017-05-20 ENCOUNTER — Other Ambulatory Visit: Payer: Self-pay | Admitting: Physician Assistant

## 2017-05-20 DIAGNOSIS — E785 Hyperlipidemia, unspecified: Secondary | ICD-10-CM

## 2017-05-21 NOTE — Telephone Encounter (Signed)
Patient has been seeing Dr. Caryn Section.

## 2017-06-08 ENCOUNTER — Ambulatory Visit (INDEPENDENT_AMBULATORY_CARE_PROVIDER_SITE_OTHER): Payer: Medicare Other | Admitting: Family Medicine

## 2017-06-08 DIAGNOSIS — Z23 Encounter for immunization: Secondary | ICD-10-CM

## 2017-06-08 NOTE — Progress Notes (Signed)
Vaccine administration only. No E&M service today.   

## 2017-10-15 DIAGNOSIS — Z8719 Personal history of other diseases of the digestive system: Secondary | ICD-10-CM | POA: Diagnosis not present

## 2017-10-15 DIAGNOSIS — K210 Gastro-esophageal reflux disease with esophagitis: Secondary | ICD-10-CM | POA: Diagnosis not present

## 2017-10-24 IMAGING — MG MM DIGITAL SCREENING BILAT W/ CAD
5 series · 5 of 5 positions shown · non-contrast
Comparison: Previous exam(s).

CLINICAL DATA: Screening.

EXAM:
DIGITAL SCREENING BILATERAL MAMMOGRAM WITH CAD

[R MLO (1 of 2)]
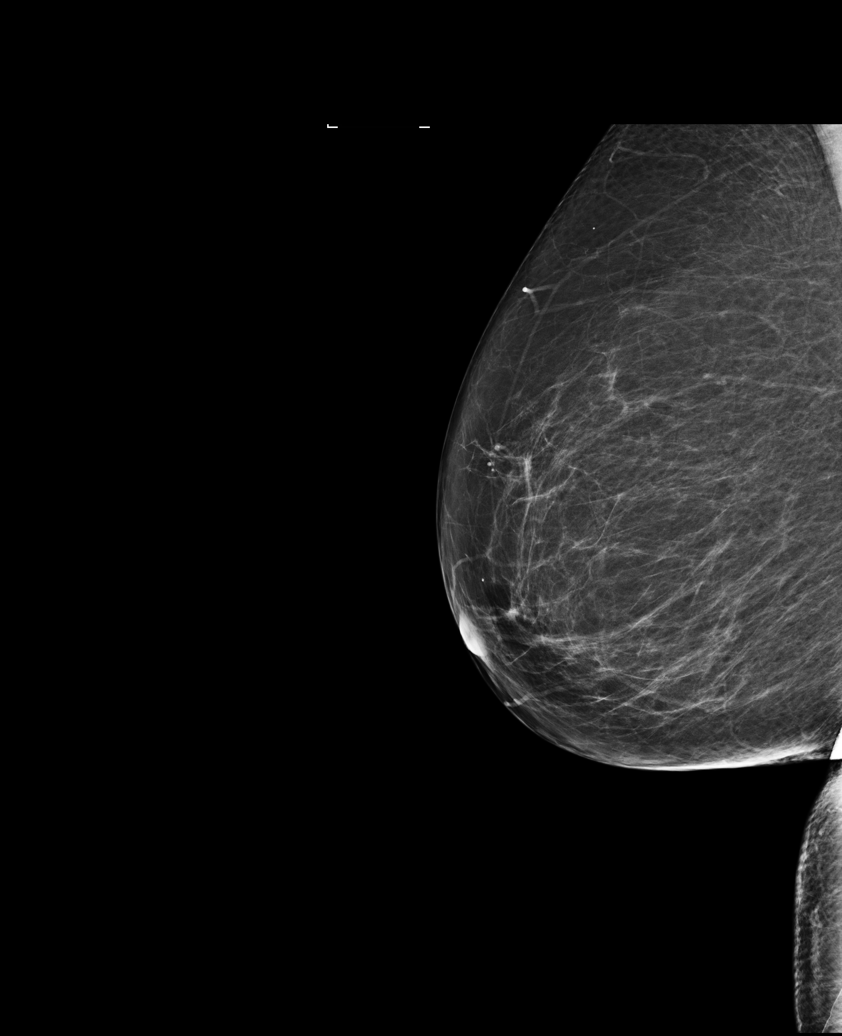

[L MLO]
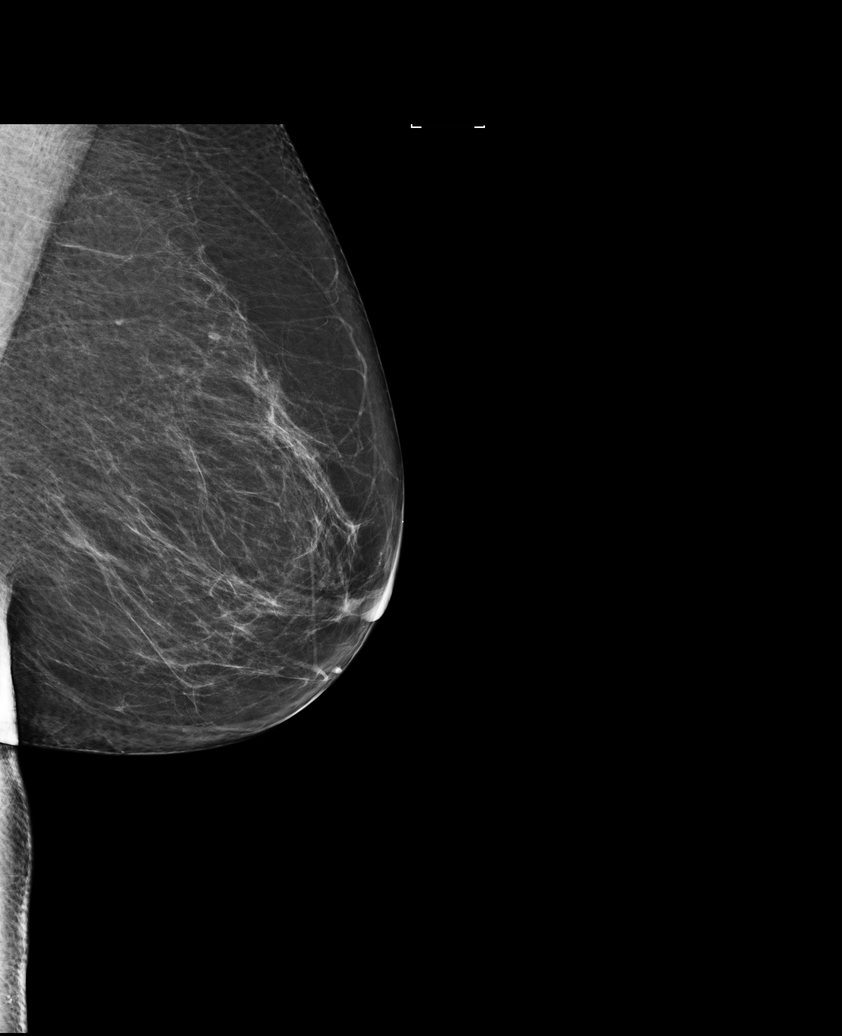

[R CC]
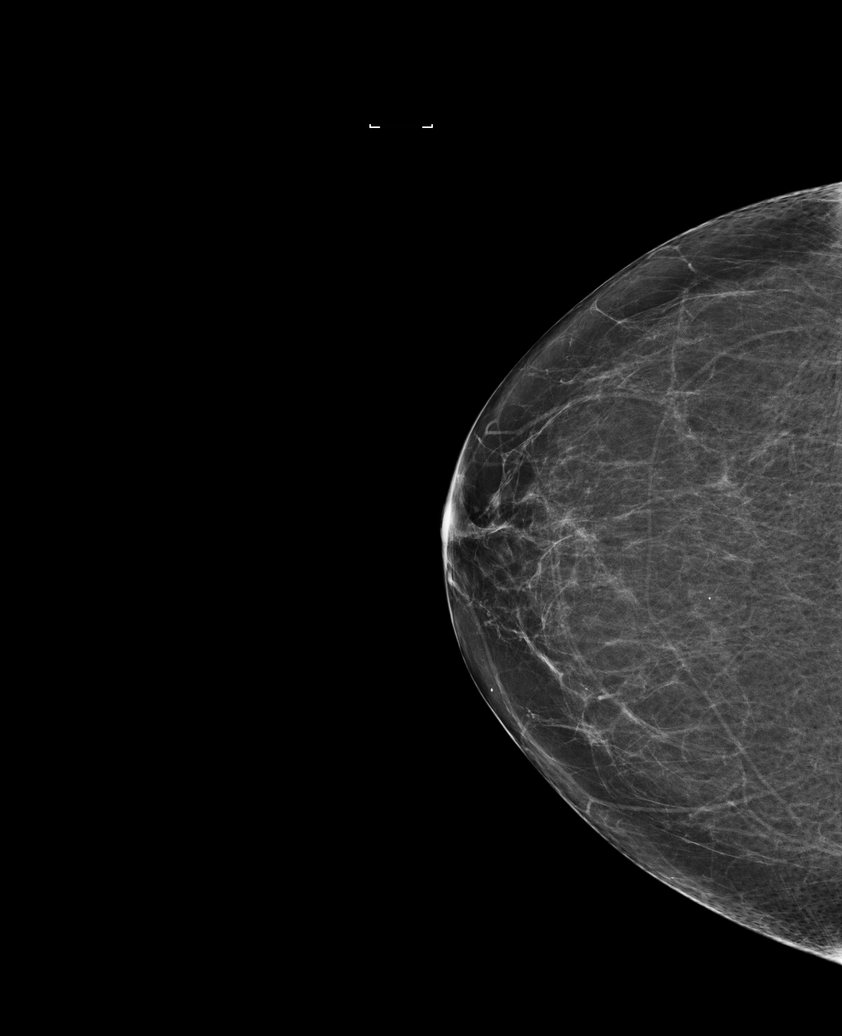

[R MLO (2 of 2)]
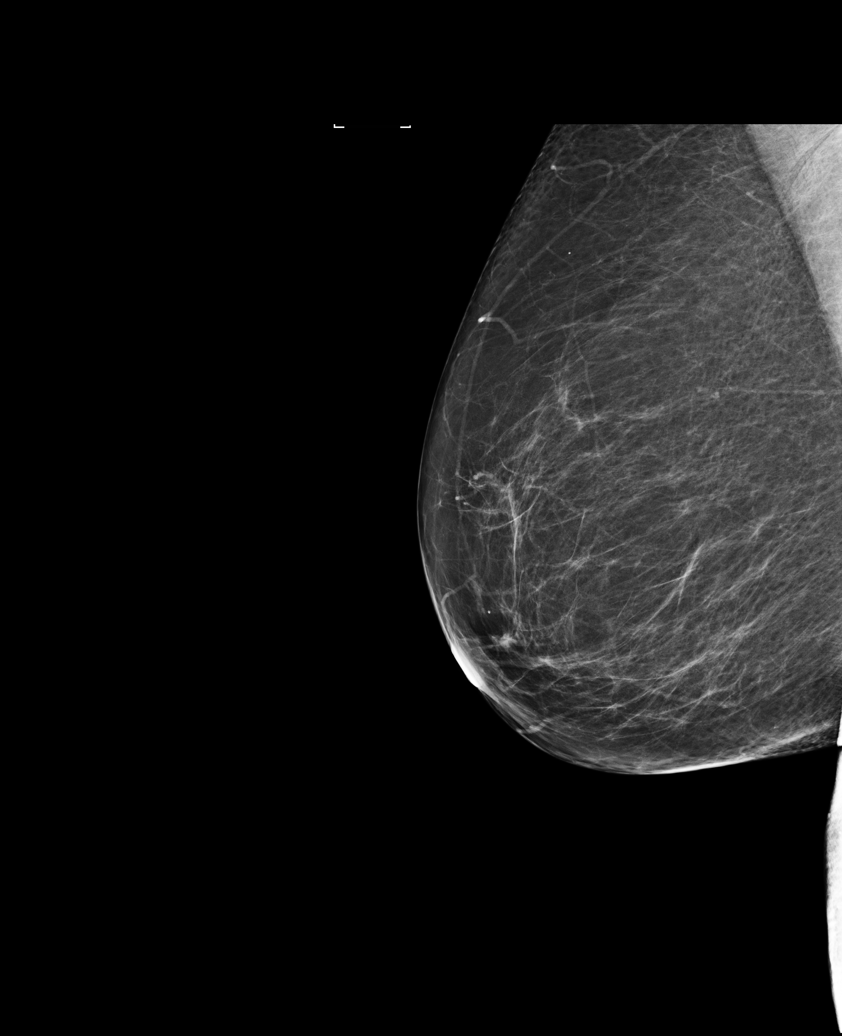

[L CC]
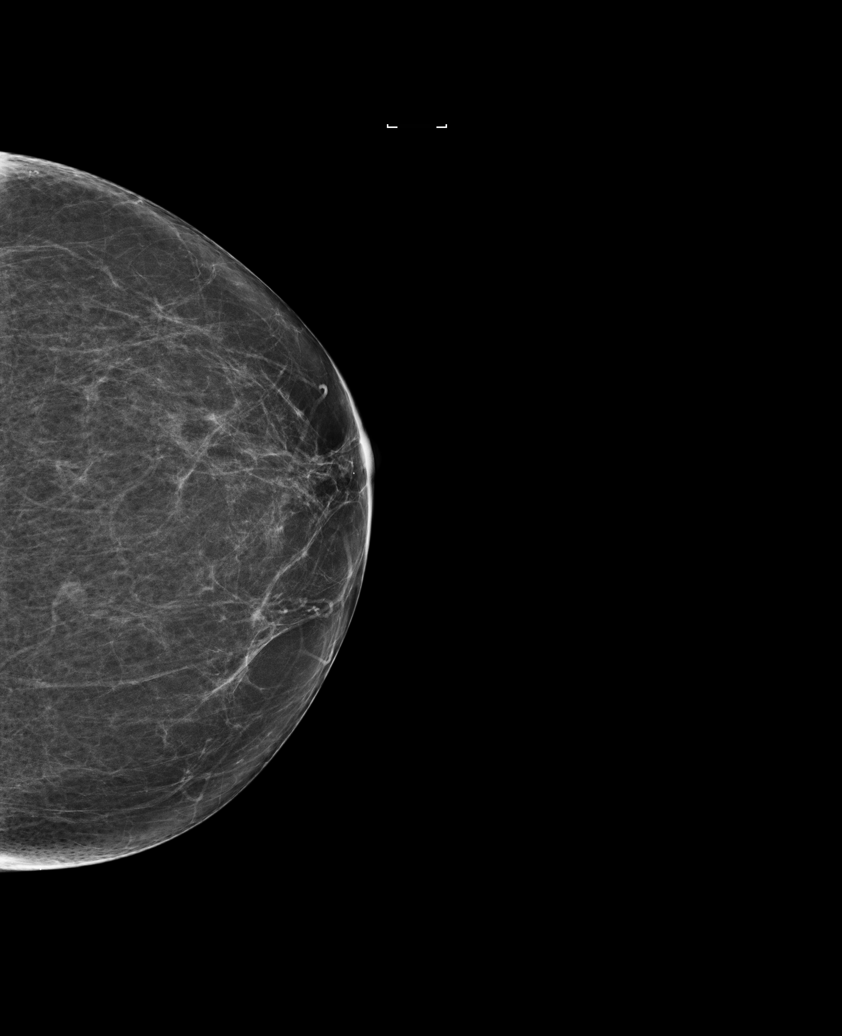

[5 of 5 positions shown; findings below may reference images not displayed]

ACR Breast Density Category b: There are scattered areas of
fibroglandular density.
FINDINGS: There are no findings suspicious for malignancy. Images were
processed with CAD.
IMPRESSION: No mammographic evidence of malignancy. A result letter of this
screening mammogram will be mailed directly to the patient.

RECOMMENDATION:
Screening mammogram in one year. (Code:AS-G-LCT)

BI-RADS CATEGORY  1: Negative.

## 2017-11-17 NOTE — Progress Notes (Signed)
Cardiology Office Note  Date:  11/19/2017   ID:  Shuntell, Foody [birth year], MRN [id number]  PCP:  Mar Daring, PA-C   Chief Complaint  Patient presents with  . other    12 month follow up. Meds reviewed by the pt. verbally. Pt. c/o palpitations.     HPI:  Ms. Munley is a very pleasant 77 year-old woman with a history of  palpitations,  polymyalgia rheumatica,  anxiety,  hyperlipidemia  who presents for routine followup for her aortic and mitral valve insufficiency previously seen on echocardiogram 8 or 9 years ago in [place]  and palpitations.  Rare dizziness Rare palpitations, takes propranolol once a week,  Otherwise feels well,  Does water coloring in her free time concerned about  Previous mitral and aortic valve disease, requesting echocardiogram Long discussion concerning risk factors for coronary disease, she has strong family history  Denies any shortness of breath or chest pain on exertion Exercises regularly, daily   total cholesterol 160 range, tolerating simvastatin 20 g daily No recent numbers available  No significant leg edema Blood pressure well controlled  Hemoglobin A1c of 6   EKG personally reviewed by myself on todays visit Shows normal sinus rhythm rate 79 bpm no significant ST or T wave changes    PMH:   has a past medical history of Anxiety, Cataract (2014), Hiatal hernia, Meningitis spinal, Mitral valve insufficiency and aortic valve insufficiency, Partial deafness, Polymyalgia rheumatica ([place]), and Squamous cell carcinoma (2010).  PSH:    Past Surgical History:  Procedure Laterality Date  . ABDOMINAL HYSTERECTOMY  1989  . CATARACT EXTRACTION    . CATARACT EXTRACTION Right 12/2011  . COLONOSCOPY  2009  . ESOPHAGOGASTRODUODENOSCOPY N/A 02/20/2015   Procedure: ESOPHAGOGASTRODUODENOSCOPY (EGD);  Surgeon: Lollie Sails, MD;  Location: [facility] ENDOSCOPY;  Service: Endoscopy;  Laterality: N/A;  . hysterectomy - prolapsed  cervix  1989  . UPPER GI ENDOSCOPY      Current Outpatient Medications  Medication Sig Dispense Refill  . b complex vitamins tablet Take 1 tablet by mouth daily.      . Calcium Carbonate-Vit D-Min (CALCIUM/VITAMIN D/MINERALS) 600-200 MG-UNIT TABS Take 1 tablet by mouth daily.      . cycloSPORINE (RESTASIS) 0.05 % ophthalmic emulsion Apply 1 drop to eye daily. In each eye    . doxycycline (VIBRAMYCIN) 100 MG capsule TAKE 1 CAPSULE(100 MG) BY MOUTH DAILY 90 capsule 1  . escitalopram (LEXAPRO) 5 MG tablet Take 1 tablet (5 mg total) by mouth daily. 90 tablet 3  . estradiol (ESTRACE) 0.1 MG/GM vaginal cream Place 1 Applicatorful vaginally at bedtime. Use pea sized application  to urethra at bedtime. 42.5 g 12  . pantoprazole (PROTONIX) 40 MG tablet TK 1 T PO QD 1 HOUR BEFORE A MEAL  9  . Probiotic Product (PROBIOTIC & ACIDOPHILUS EX ST) CAPS Take by mouth.    . propranolol (INDERAL) 20 MG tablet TAKE 1 TABLET BY MOUTH THREE TIMES DAILY AS NEEDED 270 tablet 3  . simvastatin (ZOCOR) 20 MG tablet TAKE 1 TABLET BY MOUTH AT BEDTIME FOR CHOLESTEROL 90 tablet 4   No current facility-administered medications for this visit.      Allergies:   Other   Social History:  The patient  reports that she has never smoked. She has never used smokeless tobacco. She reports that she drinks about 1.2 oz of alcohol per week. She reports that she does not use drugs.   Family History:   family  history includes Alcohol abuse in her father; Arthritis in her brother and sister; CAD in her father and mother; Congestive Heart Failure in her mother; Hypertension in her brother, mother, and sister; Stroke in her sister.    Review of Systems: Review of Systems  Constitutional: Negative.   Respiratory: Negative.   Cardiovascular: Negative.   Gastrointestinal: Negative.   Musculoskeletal: Negative.   Neurological: Negative.   Psychiatric/Behavioral: Negative.   All other systems reviewed and are  negative.    PHYSICAL EXAM: VS:  BP 122/68 (BP Location: Left Arm, Patient Position: Sitting, Cuff Size: Normal)   Pulse 79   Ht 5' 6.5" (1.689 m)   Wt 143 lb (64.9 kg)   BMI 22.74 kg/m  , BMI Body mass index is 22.74 kg/m. GEN: Well nourished, well developed, in no acute distress  HEENT: normal  Neck: no JVD, carotid bruits, or masses Cardiac: RRR; no murmurs, rubs, or gallops,no edema  Respiratory:  clear to auscultation bilaterally, normal work of breathing GI: soft, nontender, nondistended, + BS MS: no deformity or atrophy  Skin: warm and dry, no rash Neuro:  Strength and sensation are intact Psych: euthymic mood, full affect    Recent Labs: 11/21/2016: ALT 13; BUN 17; Creatinine, Ser 0.89; Potassium 4.9; Sodium 150    Lipid Panel Lab Results  Component Value Date   CHOL 193 11/21/2016   HDL 63 11/21/2016   LDLCALC 104 (H) 11/21/2016   TRIG 128 11/21/2016      Wt Readings from Last 3 Encounters:  11/19/17 143 lb (64.9 kg)  11/21/16 148 lb (67.1 kg)  11/10/16 149 lb 4 oz (67.7 kg)       ASSESSMENT AND PLAN:  Mixed hyperlipidemia - Plan: EKG 12-Lead Cholesterol is at goal on the current lipid regimen. No changes to the medications were made.  Repeat lipid panel with primary care  AORTIC & MITRAL STENOSIS W/ INSUFFI, RHEUM/NON-RHEUM - Plan: EKG 12-Lead Denies shortness of breath Reports echocardiogram 10 years ago noted aortic and mitral valve disease We will order repeat echocardiogram at her request  Essential hypertension - Plan: EKG 12-Lead Blood pressure is well controlled on today's visit. No changes made to the medications. Stable  Palpitations Rare episodes, takes propranolol as needed Propranolol refilled  Family history of coronary disease She is requesting CT coronary calcium scoring for risk stratification   Total encounter time more than 25 minutes  Greater than 50% was spent in counseling and coordination of care with the  patient   Disposition:   F/U  12 months   Orders Placed This Encounter  Procedures  . EKG 12-Lead     Signed, Esmond Plants, M.D., Ph.D. 11/19/2017  [place], [place]

## 2017-11-19 ENCOUNTER — Encounter: Payer: Self-pay | Admitting: Cardiovascular Disease

## 2017-11-19 ENCOUNTER — Ambulatory Visit: Payer: Medicare Other | Admitting: Cardiovascular Disease

## 2017-11-19 VITALS — BP 122/68 | HR 79 | Ht 66.5 in | Wt 143.0 lb

## 2017-11-19 DIAGNOSIS — E7800 Pure hypercholesterolemia, unspecified: Secondary | ICD-10-CM | POA: Diagnosis not present

## 2017-11-19 DIAGNOSIS — R7303 Prediabetes: Secondary | ICD-10-CM | POA: Diagnosis not present

## 2017-11-19 DIAGNOSIS — E782 Mixed hyperlipidemia: Secondary | ICD-10-CM | POA: Diagnosis not present

## 2017-11-19 DIAGNOSIS — I10 Essential (primary) hypertension: Secondary | ICD-10-CM | POA: Diagnosis not present

## 2017-11-19 DIAGNOSIS — I080 Rheumatic disorders of both mitral and aortic valves: Secondary | ICD-10-CM | POA: Diagnosis not present

## 2017-11-19 MED ORDER — PROPRANOLOL HCL 20 MG PO TABS: 20.0000 mg | ORAL_TABLET | Freq: Three times a day (TID) | ORAL | 3 refills | Status: AC | PRN

## 2017-11-19 NOTE — Patient Instructions (Addendum)
Medication Instructions:   No medication changes made  Labwork:  No new labs needed  Testing/Procedures:  We will order an echo for aortic and mitral valve disease  We will schedule a CT coronary calcium score  For family hx $150   Follow-Up: It was a pleasure seeing you in the office today. Please call us if you have new issues that need to be addressed before your next appt.  [phone]  Your physician wants you to follow-up in: 12 months as needed.  You will receive a reminder letter in the mail two months in advance. If you don't receive a letter, please call our office to schedule the follow-up appointment.  If you need a refill on your cardiac medications before your next appointment, please call your pharmacy.  For educational health videos Log in to : [email or web address] Or : [email or web address], password : [email or web address]

## 2017-12-01 ENCOUNTER — Ambulatory Visit (INDEPENDENT_AMBULATORY_CARE_PROVIDER_SITE_OTHER)
Admission: RE | Admit: 2017-12-01 | Discharge: 2017-12-01 | Disposition: A | Discharge status: Home / Self Care | Payer: Self-pay | Source: Ambulatory Visit | Attending: Cardiovascular Disease | Admitting: Cardiovascular Disease

## 2017-12-01 DIAGNOSIS — E782 Mixed hyperlipidemia: Secondary | ICD-10-CM

## 2017-12-09 ENCOUNTER — Other Ambulatory Visit: Payer: Self-pay

## 2017-12-09 ENCOUNTER — Ambulatory Visit (INDEPENDENT_AMBULATORY_CARE_PROVIDER_SITE_OTHER): Payer: Medicare Other

## 2017-12-09 DIAGNOSIS — I080 Rheumatic disorders of both mitral and aortic valves: Secondary | ICD-10-CM | POA: Diagnosis not present

## 2017-12-11 ENCOUNTER — Telehealth: Payer: Self-pay | Admitting: Cardiovascular Disease

## 2017-12-11 NOTE — Telephone Encounter (Signed)
New Message  Pt verbalized someone is supposed to f/u with her about her results from her Echo in three days and no one has.  Please f/u with pt

## 2017-12-11 NOTE — Telephone Encounter (Signed)
Spoke with patient and advised that results are still pending review and that I would call once those results come through. She verbalized understanding with no further questions at this time.

## 2017-12-14 ENCOUNTER — Ambulatory Visit: Payer: Medicare Other | Admitting: Family Medicine

## 2017-12-14 ENCOUNTER — Encounter: Payer: Self-pay | Admitting: Family Medicine

## 2017-12-14 VITALS — BP 124/70 | HR 67 | Temp 98.5°F | Resp 16 | Wt 145.6 lb

## 2017-12-14 DIAGNOSIS — J069 Acute upper respiratory infection, unspecified: Secondary | ICD-10-CM | POA: Diagnosis not present

## 2017-12-14 MED ORDER — HYDROCODONE-HOMATROPINE 5-1.5 MG/5ML PO SYRP
5.0000 mL | ORAL_SOLUTION | Freq: Four times a day (QID) | ORAL | 0 refills | Status: AC | PRN
Start: 2017-12-14 — End: 2017-12-19

## 2017-12-14 NOTE — Patient Instructions (Signed)
Discussed use of Claritin for nasal drip. Avoid decongestants( due to your palpitation history) and Benadryl while on cough syrup.

## 2017-12-14 NOTE — Progress Notes (Signed)
Subjective:     Patient ID: Lindsey Vaughan, female   DOB: [birth year], 77 y.o.   MRN: [id number] Chief Complaint  Patient presents with  . Cough    Patient comes in office today with complaints of cosugh and congestion for the past 5 days. Patient describes cough as deep and states that she has bene coughing up green mucous. Patient reports symptoms of wheezing, rib pain, and sore throat. Patient has tried otc Robitussin, Benadryl and Aspirin.    HPI Reports sinus congestion has just started in the last 24 hours. Cough is productive.  Review of Systems  Constitutional: Negative for chills and fever.       Objective:   Physical Exam  Constitutional: She appears well-developed and well-nourished. No distress.  Ears: T.M's intact without inflammation Throat: no tonsillar enlargement or exudate Neck: no cervical adenopathy Lungs: clear     Assessment:    1. URI, acute - HYDROcodone-homatropine (HYCODAN) 5-1.5 MG/5ML syrup; Take 5 mLs by mouth every 6 (six) hours as needed for up to 5 days. 5 ml 4-6 hours as needed for cough  Dispense: 100 mL; Refill: 0    Plan:    Discussed use of Claritin and avoiding Benadryl and decongestants (hx of palpitations).

## 2017-12-16 NOTE — Telephone Encounter (Signed)
Patient calling to check status of ECHO results Please call when ready

## 2017-12-16 NOTE — Telephone Encounter (Signed)
I called and spoke with the patient regarding her echo results. She is aware and verbalizes understanding.

## 2017-12-16 NOTE — Telephone Encounter (Signed)
The patient's echo report is still in Dr. Donivan Scull in basket for final sign off.

## 2017-12-21 ENCOUNTER — Ambulatory Visit: Payer: Medicare Other | Admitting: Family Medicine

## 2017-12-21 ENCOUNTER — Encounter: Payer: Self-pay | Admitting: Family Medicine

## 2017-12-21 ENCOUNTER — Other Ambulatory Visit: Payer: Self-pay

## 2017-12-21 VITALS — BP 120/80 | HR 53 | Temp 98.4°F | Wt 144.0 lb

## 2017-12-21 DIAGNOSIS — R059 Cough, unspecified: Secondary | ICD-10-CM

## 2017-12-21 DIAGNOSIS — R05 Cough: Secondary | ICD-10-CM

## 2017-12-21 DIAGNOSIS — J0110 Acute frontal sinusitis, unspecified: Secondary | ICD-10-CM | POA: Diagnosis not present

## 2017-12-21 MED ORDER — AMOXICILLIN 500 MG PO CAPS: 1000.0000 mg | ORAL_CAPSULE | Freq: Two times a day (BID) | ORAL | 0 refills | Status: AC

## 2017-12-21 NOTE — Progress Notes (Signed)
Patient: Lindsey Vaughan Female    DOB: [birth year]   77 y.o.   MRN: [id number] Visit Date: 12/21/2017  Today's Provider: Lelon Huh, MD   Chief Complaint  Patient presents with  . URI    was seen a week ago and not feeling any better, took cough medicine   Subjective:    HPI  Cough is better.  Nasal discharge that is yellow, green and bloody.  Husband was sick as well.  She has been dealing with this for about 3 weeks.  Headache above left eye as well.  Left side of neck seems sore.     Allergies  Allergen Reactions  . Other      Current Outpatient Medications:  .  b complex vitamins tablet, Take 1 tablet by mouth daily.  , Disp: , Rfl:  .  Calcium Carbonate-Vit D-Min (CALCIUM/VITAMIN D/MINERALS) 600-200 MG-UNIT TABS, Take 1 tablet by mouth daily.  , Disp: , Rfl:  .  escitalopram (LEXAPRO) 5 MG tablet, Take 1 tablet (5 mg total) by mouth daily., Disp: 90 tablet, Rfl: 3 .  estradiol (ESTRACE) 0.1 MG/GM vaginal cream, Place 1 Applicatorful vaginally at bedtime. Use pea sized application  to urethra at bedtime., Disp: 42.5 g, Rfl: 12 .  pantoprazole (PROTONIX) 40 MG tablet, TK 1 T PO QD 1 HOUR BEFORE A MEAL, Disp: , Rfl: 9 .  Probiotic Product (PROBIOTIC & ACIDOPHILUS EX ST) CAPS, Take by mouth., Disp: , Rfl:  .  propranolol (INDERAL) 20 MG tablet, Take 1 tablet (20 mg total) by mouth 3 (three) times daily as needed., Disp: 90 tablet, Rfl: 3 .  simvastatin (ZOCOR) 20 MG tablet, TAKE 1 TABLET BY MOUTH AT BEDTIME FOR CHOLESTEROL, Disp: 90 tablet, Rfl: 4 .  sucralfate (CARAFATE) 1 g tablet, , Disp: , Rfl: 3 .  amoxicillin (AMOXIL) 500 MG capsule, Take 2 capsules (1,000 mg total) by mouth 2 (two) times daily for 7 days., Disp: 28 capsule, Rfl: 0  Review of Systems  Constitutional: Negative for appetite change, chills, fatigue and fever.  Respiratory: Negative for chest tightness and shortness of breath.   Cardiovascular: Negative for chest pain and palpitations.    Gastrointestinal: Negative for abdominal pain, nausea and vomiting.  Neurological: Negative for dizziness and weakness.    Social History   Tobacco Use  . Smoking status: Never Smoker  . Smokeless tobacco: Never Used  . Tobacco comment: tobacco use - no   Substance Use Topics  . Alcohol use: Yes    Alcohol/week: 1.2 oz    Types: 2 Glasses of wine per week    Comment: 2 glasses of wine on weekend    Objective:   BP 120/80 (BP Location: Left Arm, Patient Position: Sitting, Cuff Size: Normal)   Pulse (!) 53   Temp 98.4 F (36.9 C) (Oral)   Wt 144 lb (65.3 kg)   SpO2 99%   BMI 22.89 kg/m  Vitals:   12/21/17 1132  BP: 120/80  Pulse: (!) 53  Temp: 98.4 F (36.9 C)  TempSrc: Oral  SpO2: 99%  Weight: 144 lb (65.3 kg)     Physical Exam  General Appearance:    Alert, cooperative, no distress  HENT:   bilateral TM normal without fluid or infection, neck without nodes, neck has left anterior cervical nodes enlarged, left frontal sinus tender and nasal mucosa pale and congested  Eyes:    PERRL, conjunctiva/corneas clear, EOM's intact  Lungs:     Clear to auscultation bilaterally, respirations unlabored  Heart:    Regular rate and rhythm  Neurologic:   Awake, alert, oriented x 3. No apparent focal neurological           defect.           Assessment & Plan:     1. Cough Improving.   2. Acute frontal sinusitis, recurrence not specified  - amoxicillin (AMOXIL) 500 MG capsule; Take 2 capsules (1,000 mg total) by mouth 2 (two) times daily for 7 days.  Dispense: 28 capsule; Refill: 0  Call if symptoms change or if not rapidly improving.           Lelon Huh, MD  [facility]

## 2018-01-18 ENCOUNTER — Other Ambulatory Visit: Payer: Self-pay | Admitting: Family Medicine

## 2018-01-18 DIAGNOSIS — F411 Generalized anxiety disorder: Secondary | ICD-10-CM

## 2018-03-18 DIAGNOSIS — M3501 Sicca syndrome with keratoconjunctivitis: Secondary | ICD-10-CM | POA: Diagnosis not present

## 2018-04-19 DIAGNOSIS — L719 Rosacea, unspecified: Secondary | ICD-10-CM | POA: Diagnosis not present

## 2018-04-19 DIAGNOSIS — D225 Melanocytic nevi of trunk: Secondary | ICD-10-CM | POA: Diagnosis not present

## 2018-04-19 DIAGNOSIS — D2230 Melanocytic nevi of unspecified part of face: Secondary | ICD-10-CM | POA: Diagnosis not present

## 2018-04-19 DIAGNOSIS — L821 Other seborrheic keratosis: Secondary | ICD-10-CM | POA: Diagnosis not present

## 2018-04-30 ENCOUNTER — Encounter: Payer: Self-pay | Admitting: Family Medicine

## 2018-04-30 ENCOUNTER — Ambulatory Visit (INDEPENDENT_AMBULATORY_CARE_PROVIDER_SITE_OTHER): Payer: Medicare Other | Admitting: Family Medicine

## 2018-04-30 VITALS — BP 132/72 | HR 64 | Temp 98.3°F | Resp 16 | Ht 67.0 in | Wt 141.0 lb

## 2018-04-30 DIAGNOSIS — Z0000 Encounter for general adult medical examination without abnormal findings: Secondary | ICD-10-CM

## 2018-04-30 DIAGNOSIS — Z23 Encounter for immunization: Secondary | ICD-10-CM

## 2018-04-30 DIAGNOSIS — G4709 Other insomnia: Secondary | ICD-10-CM

## 2018-04-30 DIAGNOSIS — M810 Age-related osteoporosis without current pathological fracture: Secondary | ICD-10-CM

## 2018-04-30 DIAGNOSIS — E7800 Pure hypercholesterolemia, unspecified: Secondary | ICD-10-CM

## 2018-04-30 DIAGNOSIS — I10 Essential (primary) hypertension: Secondary | ICD-10-CM

## 2018-04-30 DIAGNOSIS — R7303 Prediabetes: Secondary | ICD-10-CM

## 2018-04-30 DIAGNOSIS — K219 Gastro-esophageal reflux disease without esophagitis: Secondary | ICD-10-CM

## 2018-04-30 DIAGNOSIS — E559 Vitamin D deficiency, unspecified: Secondary | ICD-10-CM

## 2018-04-30 MED ORDER — LORAZEPAM 0.5 MG PO TABS: 1.0000 mg | ORAL_TABLET | Freq: Every evening | ORAL | 0 refills | Status: DC | PRN

## 2018-04-30 NOTE — Progress Notes (Signed)
Patient: Lindsey Vaughan, Female    DOB: 10/22/1940, 77 y.o.   MRN: [id number] Visit Date: 04/30/2018  Today's Provider: Lelon Huh, MD   Chief Complaint  Patient presents with  . Medicare Wellness  . Hypertension  . Hyperlipidemia  . Gastroesophageal Reflux  . Anxiety  . Annual Exam   Subjective:    Annual wellness visit Lindsey Vaughan is a 77 y.o. female. She feels well. She reports exercising 5 times a week. She reports she is sleeping fairly well.  -----------------------------------------------------------  Hypertension, follow-up:  BP Readings from Last 3 Encounters:  04/30/18 132/72  12/21/17 120/80  12/14/17 124/70    She was last seen for hypertension 1 years ago.  BP at that visit was 116/62. Management since that visit includes no changes. She reports good compliance with treatment. She is not having side effects.  She is exercising. She is not adherent to low salt diet.   Outside blood pressures are not being checked. She is experiencing palpitations.  Patient denies chest pain, chest pressure/discomfort, claudication, dyspnea, exertional chest pressure/discomfort, fatigue, irregular heart beat, lower extremity edema, near-syncope, orthopnea, paroxysmal nocturnal dyspnea, syncope and tachypnea.   Cardiovascular risk factors include advanced age (older than 22 for men, 60 for women) and hypertension.  Use of agents associated with hypertension: none.     Weight trend: fluctuating a bit Wt Readings from Last 3 Encounters:  04/30/18 141 lb (64 kg)  12/21/17 144 lb (65.3 kg)  12/14/17 145 lb 9.6 oz (66 kg)    Current diet: well balanced  ------------------------------------------------------------------------ Follow up of Vitamin D Deficiency: Patient was last seen for this problem more than 1 year ago and no changes were made.  Lab Results  Component Value Date   VD25OH 49.4 11/21/2016    Follow up of GERD: Patient was last seen  for this problem more than 1 year ago and no changes were made. Patient reports this problem is stable with current medication. Patient reports this problem is stable  Follow up of Anxiety:  Patient was last seen for this problem more than 1 year ago. Management during that visit includes weaning Lexapro. Patient reports this problem is stable.    Lipid/Cholesterol, Follow-up:   Last seen for this1 years ago.  Management changes since that visit include none. . Last Lipid Panel:    Component Value Date/Time   CHOL 193 11/21/2016 1006   TRIG 128 11/21/2016 1006   HDL 63 11/21/2016 1006   CHOLHDL 3.1 11/21/2016 1006   LDLCALC 104 (H) 11/21/2016 1006    Risk factors for vascular disease include hypercholesterolemia and hypertension  She reports good compliance with treatment. She is not having side effects.  Current symptoms include none and have been stable. Weight trend: fluctuating a bit Prior visit with dietician: no Current diet: well balanced Current exercise: swimming and also silver sneakers  Wt Readings from Last 3 Encounters:  04/30/18 141 lb (64 kg)  12/21/17 144 lb (65.3 kg)  12/14/17 145 lb 9.6 oz (66 kg)    ------------------------------------------------------------------- Follow up of Pre-Diabetes: Patient was last seen for this problem more than 1 year ago and no changes were made. Patient reports good compliance.    Review of Systems  Constitutional: Negative for chills, fatigue and fever.  HENT: Positive for hearing loss and postnasal drip. Negative for congestion, ear pain, rhinorrhea, sneezing and sore throat.   Eyes: Negative.  Negative for pain and redness.  Respiratory: Negative for  cough, shortness of breath and wheezing.   Cardiovascular: Positive for palpitations. Negative for chest pain and leg swelling.  Gastrointestinal: Positive for constipation. Negative for abdominal pain, blood in stool, diarrhea and nausea.  Endocrine: Negative for  polydipsia and polyphagia.  Genitourinary: Negative.  Negative for dysuria, flank pain, hematuria, pelvic pain, vaginal bleeding and vaginal discharge.       Incontinence  Musculoskeletal: Positive for arthralgias, back pain, neck pain and neck stiffness. Negative for gait problem and joint swelling.  Skin: Negative for rash.  Neurological: Negative.  Negative for dizziness, tremors, seizures, weakness, light-headedness, numbness and headaches.  Hematological: Negative for adenopathy.  Psychiatric/Behavioral: Negative.  Negative for behavioral problems, confusion and dysphoric mood. The patient is not nervous/anxious and is not hyperactive.     Social History   Socioeconomic History  . Marital status: Married    Spouse name: Not on file  . Number of children: 2  . Years of education: Not on file  . Highest education level: Not on file  Occupational History  . Occupation: Retired  Scientific laboratory technician  . Financial resource strain: Not on file  . Food insecurity:    Worry: Not on file    Inability: Not on file  . Transportation needs:    Medical: Not on file    Non-medical: Not on file  Tobacco Use  . Smoking status: Never Smoker  . Smokeless tobacco: Never Used  . Tobacco comment: tobacco use - no   Substance and Sexual Activity  . Alcohol use: Yes    Alcohol/week: 2.0 standard drinks    Types: 2 Glasses of wine per week    Comment: 2 glasses of wine on weekend   . Drug use: No  . Sexual activity: Not on file  Lifestyle  . Physical activity:    Days per week: Not on file    Minutes per session: Not on file  . Stress: Not on file  Relationships  . Social connections:    Talks on phone: Not on file    Gets together: Not on file    Attends religious service: Not on file    Active member of club or organization: Not on file    Attends meetings of clubs or organizations: Not on file    Relationship status: Not on file  . Intimate partner violence:    Fear of current or ex  partner: Not on file    Emotionally abused: Not on file    Physically abused: Not on file    Forced sexual activity: Not on file  Other Topics Concern  . Not on file  Social History Narrative   Married; retired; gets regular exercise - swims twice a week, walks 4 times a week.     Past Medical History:  Diagnosis Date  . Anxiety   . Cataract 2014   left eye  . Hiatal hernia   . Meningitis spinal    9 months old   . Mitral valve insufficiency and aortic valve insufficiency    shown on echo 2006 in [place]  . Partial deafness    L ear  . Polymyalgia rheumatica ([place])   . Squamous cell carcinoma 2010   upper back      Patient Active Problem List   Diagnosis Date Noted  . Polyneuropathy 05/18/2017  . Left knee pain 11/21/2016  . Gallbladder sludge 02/12/2015  . Hematuria 02/08/2015  . Arthritis 12/22/2014  . GERD (gastroesophageal reflux disease) 12/22/2014  . History of abdominal  hernia 12/22/2014  . OP (osteoporosis) 12/22/2014  . Acne erythematosa 12/22/2014  . Lesion of right sciatic nerve 12/22/2014  . CA of skin 12/22/2014  . Avitaminosis D 12/22/2014  . Urethral caruncle 07/11/2013  . Midline cystocele 07/11/2013  . Hematuria, microscopic 07/04/2013  . Female stress incontinence 07/04/2013  . Hypertension 05/24/2012  . AORTIC & MITRAL STENOSIS W/ INSUFFI, RHEUM/NON-RHEUM 04/26/2010  . Palpitations 04/26/2010  . Hypercholesteremia 10/22/2009  . Pre-diabetes 10/04/2009  . AG (atrophic gastritis) 10/04/2009  . Internal hemorrhoids without complication 54/00/8676  . 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 Anxiety state 06/14/2009    Past Surgical History:  Procedure Laterality Date  . ABDOMINAL HYSTERECTOMY  1989  . CATARACT EXTRACTION    . CATARACT EXTRACTION Right 12/2011  . COLONOSCOPY  2009  . ESOPHAGOGASTRODUODENOSCOPY N/A 02/20/2015   Procedure: ESOPHAGOGASTRODUODENOSCOPY (EGD);  Surgeon: Lollie Sails, MD;  Location: [facility] ENDOSCOPY;   Service: Endoscopy;  Laterality: N/A;  . hysterectomy - prolapsed cervix  1989  . UPPER GI ENDOSCOPY      Her family history includes Alcohol abuse in her father; Arthritis in her brother and sister; CAD in her father and mother; Congestive Heart Failure in her mother; Hypertension in her brother, mother, and sister; Stroke in her sister. There is no history of Breast cancer.      Current Outpatient Medications:  .  b complex vitamins tablet, Take 1 tablet by mouth daily.  , Disp: , Rfl:  .  Calcium Carbonate-Vit D-Min (CALCIUM/VITAMIN D/MINERALS) 600-200 MG-UNIT TABS, Take 1 tablet by mouth daily.  , Disp: , Rfl:  .  escitalopram (LEXAPRO) 5 MG tablet, TAKE 1 TABLET(5 MG) BY MOUTH DAILY, Disp: 90 tablet, Rfl: 0 .  estradiol (ESTRACE) 0.1 MG/GM vaginal cream, Place 1 Applicatorful vaginally at bedtime. Use pea sized application  to urethra at bedtime., Disp: 42.5 g, Rfl: 12 .  pantoprazole (PROTONIX) 40 MG tablet, TK 1 T PO QD 1 HOUR BEFORE A MEAL, Disp: , Rfl: 9 .  Probiotic Product (PROBIOTIC & ACIDOPHILUS EX ST) CAPS, Take by mouth., Disp: , Rfl:  .  propranolol (INDERAL) 20 MG tablet, Take 1 tablet (20 mg total) by mouth 3 (three) times daily as needed., Disp: 90 tablet, Rfl: 3 .  simvastatin (ZOCOR) 20 MG tablet, TAKE 1 TABLET BY MOUTH AT BEDTIME FOR CHOLESTEROL, Disp: 90 tablet, Rfl: 4 .  sucralfate (CARAFATE) 1 g tablet, , Disp: , Rfl: 3  Patient Care Team: Mar Daring, PA-C as PCP - General (Family Medicine) Minna Merritts, MD as Consulting Physician (Cardiology) Birder Robson, MD as Referring Physician (Ophthalmology) Ralene Bathe, MD (Dermatology)     Objective:   Vitals: BP 132/72 (BP Location: Left Arm, Patient Position: Sitting, Cuff Size: Normal)   Pulse 64   Temp 98.3 F (36.8 C) (Oral)   Resp 16   Ht 5\' 7"  (1.702 m)   Wt 141 lb (64 kg)   SpO2 98% Comment: room air  BMI 22.08 kg/m   Physical Exam     General Appearance:    Alert,  cooperative, no distress, appears stated age  Head:    Normocephalic, without obvious abnormality, atraumatic  Eyes:    PERRL, conjunctiva/corneas clear, EOM's intact, fundi    benign, both eyes  Ears:    Normal TM's and external ear canals, both ears  Nose:   Nares normal, septum midline, mucosa normal, no drainage    or sinus tenderness  Throat:   Lips, mucosa, and  tongue normal; teeth and gums normal  Neck:   Supple, symmetrical, trachea midline, no adenopathy;    thyroid:  no enlargement/tenderness/nodules; no carotid   bruit or JVD  Back:     Symmetric, no curvature, ROM normal, no CVA tenderness  Lungs:     Clear to auscultation bilaterally, respirations unlabored  Chest Wall:    No tenderness or deformity   Heart:    Regular rate and rhythm, S1 and S2 normal, no murmur, rub   or gallop  Breast Exam:    normal appearance, no masses or tenderness, deferred  Abdomen:     Soft, non-tender, bowel sounds active all four quadrants,    no masses, no organomegaly  Pelvic:    deferred  Extremities:   Extremities normal, atraumatic, no cyanosis or edema  Pulses:   2+ and symmetric all extremities  Skin:   Skin color, texture, turgor normal, no rashes or lesions  Lymph nodes:   Cervical, supraclavicular, and axillary nodes normal  Neurologic:   CNII-XII intact, normal strength, sensation and reflexes    throughout    Activities of Daily Living No flowsheet data found.  Fall Risk Assessment Fall Risk  04/30/2018 11/21/2016 11/05/2015 02/08/2015  Falls in the past year? No No No No     Depression Screen PHQ 2/9 Scores 04/30/2018 11/21/2016 11/05/2015 02/08/2015  PHQ - 2 Score 0 0 0 0  PHQ- 9 Score 0 0 - -    Cognitive Testing - 6-CIT  Correct? Score   What year is it? yes 0 0 or 4  What month is it? yes 0 0 or 3  Memorize:    Pia Mau,  41,  [address],  [place],      What time is it? (within 1 hour) yes 0 0 or 3  Count backwards from 20 yes 0 0, 2, or 4  Name the months of the year  yes 0 0, 2, or 4  Repeat name & address above no 3 0, 2, 4, 6, 8, or 10       TOTAL SCORE  3/28   Interpretation:  Normal  Normal (0-7) Abnormal (8-28)    Audit-C Alcohol Use Screening   Alcohol Use Disorder Test (AUDIT) 04/30/2018  1. How often do you have a drink containing alcohol? 3  2. How many drinks containing alcohol do you have on a typical day when you are drinking? 0  3. How often do you have six or more drinks on one occasion? 0  AUDIT-C Score 3  4. How often during the last year have you found that you were not able to stop drinking once you had started? 0  5. How often during the last year have you failed to do what was normally expected from you becasue of drinking? 0  6. How often during the last year have you needed a first drink in the morning to get yourself going after a heavy drinking session? 0  7. How often during the last year have you had a feeling of guilt of remorse after drinking? 0  8. How often during the last year have you been unable to remember what happened the night before because you had been drinking? 0  9. Have you or someone else been injured as a result of your drinking? 0  10. Has a relative or friend or a doctor or another health worker been concerned about your drinking or suggested you cut down? 0  Alcohol Use Disorder  Identification Test Final Score (AUDIT) 3  Intervention/Follow-up AUDIT Score <7 follow-up not indicated    A score of 3 or more in women, and 4 or more in men indicates increased risk for alcohol abuse, EXCEPT if all of the points are from question 1    Assessment & Plan:     Annual Wellness Visit  Reviewed patient's Family Medical History Reviewed and updated list of patient's medical providers Assessment of cognitive impairment was done Assessed patient's functional ability Established a written schedule for health screening [place] Completed and Reviewed  Exercise Activities and Dietary  recommendations Goals   None     Immunization History  Administered Date(s) Administered  . Influenza Split 06/14/2009, 07/02/2011, 06/07/2012  . Influenza, High Dose Seasonal PF 06/20/2014, 07/05/2016, 06/08/2017  . Influenza,inj,Quad PF,6+ Mos 06/15/2013  . Influenza-Unspecified 06/11/2015  . Pneumococcal Conjugate-13 10/05/2014  . Pneumococcal Polysaccharide-23 11/05/2015  . Tdap 05/21/2011  . Zoster 04/17/2010    Health Maintenance  Topic Date Due  . INFLUENZA VACCINE  04/01/2018  . DEXA SCAN  11/21/2018  . TETANUS/TDAP  05/20/2021  . PNA vac Low Risk Adult  Completed     Discussed health benefits of physical activity, and encouraged her to engage in regular exercise appropriate for her age and condition.    ------------------------------------------------------------------------------------------------------------  1. Annual physical exam Generally doing well. Counseled on BMD screening due to history of osteopenia, last was in 2017. She has several trips coming up and wants to wait until next year. Plan on BMD in 2020.   2. Need for influenza vaccination  - Flu vaccine HIGH DOSE PF (Fluzone High dose)  3. Essential hypertension Well controlled.  Continue current medications.    4. Gastroesophageal reflux disease, esophagitis presence not specified Well controlled on current medications.   5. Age related osteoporosis, unspecified pathological fracture presence   6. Pre-diabetes  - Hemoglobin A1c  7. Avitaminosis D  - VITAMIN D 25 Hydroxy (Vit-D Deficiency, Fractures)  8. Hypercholesteremia She is tolerating simvastatin well with no adverse effects.   - Comprehensive metabolic panel - Lipid panel  9. Other insomnia She requests something to help sleep with her upcoming travels.  - LORazepam (ATIVAN) 0.5 MG tablet; Take 2 tablets (1 mg total) by mouth at bedtime as needed for anxiety.  Dispense: 20 tablet; Refill: 0   Lelon Huh, MD  [facility]

## 2018-04-30 NOTE — Patient Instructions (Addendum)
   The CDC recommends two doses of Shingrix (the shingles vaccine) separated by 2 to 6 months for adults age 78 years and older. I recommend checking with your insurance plan regarding coverage for this vaccine.   

## 2018-05-04 ENCOUNTER — Other Ambulatory Visit: Payer: Self-pay | Admitting: Family Medicine

## 2018-05-04 DIAGNOSIS — F411 Generalized anxiety disorder: Secondary | ICD-10-CM

## 2018-05-07 DIAGNOSIS — E559 Vitamin D deficiency, unspecified: Secondary | ICD-10-CM | POA: Diagnosis not present

## 2018-05-07 DIAGNOSIS — R7303 Prediabetes: Secondary | ICD-10-CM | POA: Diagnosis not present

## 2018-05-07 DIAGNOSIS — E7800 Pure hypercholesterolemia, unspecified: Secondary | ICD-10-CM | POA: Diagnosis not present

## 2018-05-08 LAB — LIPID PANEL
CHOLESTEROL TOTAL: 183 mg/dL (ref 100–199)
Chol/HDL Ratio: 2.7 ratio (ref 0.0–4.4)
HDL: 67 mg/dL (ref >39)
LDL Calculated: 95 mg/dL (ref 0–99)
Triglycerides: 106 mg/dL (ref 0–149)
VLDL CHOLESTEROL CAL: 21 mg/dL (ref 5–40)

## 2018-05-08 LAB — COMPREHENSIVE METABOLIC PANEL
ALBUMIN: 4.2 g/dL (ref 3.5–4.8)
ALK PHOS: 68 IU/L (ref 39–117)
ALT: 18 IU/L (ref 0–32)
AST: 20 IU/L (ref 0–40)
Albumin/Globulin Ratio: 1.7 (ref 1.2–2.2)
BILIRUBIN TOTAL: 0.4 mg/dL (ref 0.0–1.2)
BUN / CREAT RATIO: 17 (ref 12–28)
BUN: 15 mg/dL (ref 8–27)
CHLORIDE: 103 mmol/L (ref 96–106)
CO2: 26 mmol/L (ref 20–29)
Calcium: 9.5 mg/dL (ref 8.7–10.3)
Creatinine, Ser: 0.89 mg/dL (ref 0.57–1.00)
GFR calc Af Amer: 72 mL/min/{1.73_m2} (ref >59)
GFR calc non Af Amer: 63 mL/min/{1.73_m2} (ref >59)
GLUCOSE: 97 mg/dL (ref 65–99)
Globulin, Total: 2.5 g/dL (ref 1.5–4.5)
Potassium: 4.1 mmol/L (ref 3.5–5.2)
Sodium: 145 mmol/L — ABNORMAL HIGH (ref 134–144)
Total Protein: 6.7 g/dL (ref 6.0–8.5)

## 2018-05-08 LAB — HEMOGLOBIN A1C
ESTIMATED AVERAGE GLUCOSE: 128 mg/dL
HEMOGLOBIN A1C: 6.1 % — AB (ref 4.8–5.6)

## 2018-05-08 LAB — VITAMIN D 25 HYDROXY (VIT D DEFICIENCY, FRACTURES): Vit D, 25-Hydroxy: 50.8 ng/mL (ref 30.0–100.0)

## 2018-08-23 ENCOUNTER — Other Ambulatory Visit: Payer: Self-pay | Admitting: Family Medicine

## 2018-08-23 DIAGNOSIS — E785 Hyperlipidemia, unspecified: Secondary | ICD-10-CM

## 2018-09-02 ENCOUNTER — Other Ambulatory Visit: Payer: Self-pay | Admitting: Family Medicine

## 2018-09-02 DIAGNOSIS — Z1231 Encounter for screening mammogram for malignant neoplasm of breast: Secondary | ICD-10-CM

## 2018-09-17 ENCOUNTER — Telehealth: Payer: Self-pay | Admitting: Cardiovascular Disease

## 2018-09-17 NOTE — Telephone Encounter (Signed)
Spoke with patient and she states that after a 40 minute swim she had some dizziness. She then went home and had her blood pressure checked and it showed irregular rhythm on the blood pressure machine. She states that this has never showed up before for her. Reviewed that those indicators may not always be accurate and she states that she only felt something different once at that time. Reviewed that PVC's and PAC's can sometimes cause that indicator to come on. She states that she has scheduled appointment coming up in February. Recommended that she continue to monitor and give [place] a call if these symptoms persist or worsen and we will see her at upcoming appointment. Reviewed that we do have a option to place a monitor if these continue and provider can review at upcoming visit. She said that if she doesn't have any further symptoms she may cancel upcoming appointment. Recommended that she keep it for now and see how she feels. She verbalized understanding, agreement with plan, and had no further questions at this time.

## 2018-09-17 NOTE — Telephone Encounter (Signed)
Could also have patient come in for nurse visit for EKG to evaluate for significant arrhythmia.

## 2018-09-17 NOTE — Telephone Encounter (Signed)
STAT if patient feels like he/she is going to faint   1) Are you dizzy now? No Started last week at the pool   2) Do you feel faint or have you passed out?  No   3) Do you have any other symptoms? Odd irregular beats   4) Have you checked your HR and BP (record if available)? 118/52  HR 67    Scheduled 2/6 with [place]

## 2018-09-17 NOTE — Telephone Encounter (Signed)
Left detailed voicemail message that provider recommended nurse visit for EKG check to review any type of arrhythmia and that someone would call her on Monday to assist with scheduling this one day next week with instructions to call back if any further questions.

## 2018-09-20 NOTE — Telephone Encounter (Signed)
Scheduled

## 2018-09-21 ENCOUNTER — Ambulatory Visit (INDEPENDENT_AMBULATORY_CARE_PROVIDER_SITE_OTHER): Payer: Medicare Other | Admitting: *Deleted

## 2018-09-21 VITALS — BP 128/68 | HR 68 | Ht 66.5 in | Wt 142.0 lb

## 2018-09-21 DIAGNOSIS — R002 Palpitations: Secondary | ICD-10-CM

## 2018-09-21 DIAGNOSIS — I10 Essential (primary) hypertension: Secondary | ICD-10-CM | POA: Diagnosis not present

## 2018-09-21 NOTE — Progress Notes (Signed)
1.) Reason for visit: Palpitations, irregular heart rate on home BP machine  2.) Name of MD requesting visit: Christell Faith, PA-C*  3.) H&P: HTN, palpitations  4.) ROS related to problem: patient called in last week with compliant of dizziness and irregular heart rate (as indicated on her home BP machine). These occurred after her routine 40 minute swim which she does a few times a week. States she takes her BP/HR upon getting home about 30 minutes after her swim and the monitor shows that her heart rate/rhythm is irregular. She does not really feel anything, denies palpitations or chest pain. Only on one occasion did she get real dizzy and feel faint.   Today, she had EKG performed and vital signs checked. Med list reviewed.   5.) Assessment and plan per MD: Advised patient to continue current medications and keep follow up with Christell Faith, PA-C as scheduled on 10/07/2018. Routing to [place] for his further review.

## 2018-09-22 ENCOUNTER — Other Ambulatory Visit: Payer: Self-pay | Admitting: Family Medicine

## 2018-09-22 ENCOUNTER — Ambulatory Visit
Admission: RE | Admit: 2018-09-22 | Discharge: 2018-09-22 | Disposition: A | Discharge status: Home / Self Care | Payer: Medicare Other | Source: Ambulatory Visit | Attending: Family Medicine | Admitting: Family Medicine

## 2018-09-22 DIAGNOSIS — Z1231 Encounter for screening mammogram for malignant neoplasm of breast: Secondary | ICD-10-CM | POA: Diagnosis not present

## 2018-09-22 DIAGNOSIS — R928 Other abnormal and inconclusive findings on diagnostic imaging of breast: Secondary | ICD-10-CM

## 2018-09-22 NOTE — Progress Notes (Signed)
EKG reviewed and shows NSR. Await appointment.

## 2018-09-22 NOTE — Addendum Note (Signed)
Addended by: Vanessa Ralphs on: 09/22/2018 08:33 AM   Modules accepted: Orders

## 2018-10-01 ENCOUNTER — Ambulatory Visit
Admission: RE | Admit: 2018-10-01 | Discharge: 2018-10-01 | Disposition: A | Discharge status: Home / Self Care | Payer: Medicare Other | Source: Ambulatory Visit | Attending: Family Medicine | Admitting: Family Medicine

## 2018-10-01 DIAGNOSIS — R928 Other abnormal and inconclusive findings on diagnostic imaging of breast: Secondary | ICD-10-CM | POA: Diagnosis not present

## 2018-10-07 ENCOUNTER — Ambulatory Visit: Payer: Medicare Other | Admitting: Physician Assistant

## 2018-10-26 ENCOUNTER — Ambulatory Visit: Payer: Medicare Other | Admitting: Physician Assistant

## 2019-02-21 DIAGNOSIS — M3501 Sicca syndrome with keratoconjunctivitis: Secondary | ICD-10-CM | POA: Diagnosis not present

## 2019-02-22 DIAGNOSIS — K210 Gastro-esophageal reflux disease with esophagitis: Secondary | ICD-10-CM | POA: Diagnosis not present

## 2019-02-22 DIAGNOSIS — Z1211 Encounter for screening for malignant neoplasm of colon: Secondary | ICD-10-CM | POA: Diagnosis not present

## 2019-02-22 DIAGNOSIS — R140 Abdominal distension (gaseous): Secondary | ICD-10-CM | POA: Diagnosis not present

## 2019-03-24 DIAGNOSIS — Z1211 Encounter for screening for malignant neoplasm of colon: Secondary | ICD-10-CM | POA: Diagnosis not present

## 2019-03-24 DIAGNOSIS — Z1212 Encounter for screening for malignant neoplasm of rectum: Secondary | ICD-10-CM | POA: Diagnosis not present

## 2019-04-14 ENCOUNTER — Ambulatory Visit (INDEPENDENT_AMBULATORY_CARE_PROVIDER_SITE_OTHER): Payer: Medicare Other | Admitting: Physician Assistant

## 2019-04-14 ENCOUNTER — Encounter: Payer: Self-pay | Admitting: Physician Assistant

## 2019-04-14 ENCOUNTER — Other Ambulatory Visit: Payer: Self-pay

## 2019-04-14 VITALS — BP 148/77 | HR 77 | Temp 98.3°F | Resp 16 | Wt 140.0 lb

## 2019-04-14 DIAGNOSIS — F411 Generalized anxiety disorder: Secondary | ICD-10-CM | POA: Diagnosis not present

## 2019-04-14 DIAGNOSIS — S93492A Sprain of other ligament of left ankle, initial encounter: Secondary | ICD-10-CM | POA: Diagnosis not present

## 2019-04-14 DIAGNOSIS — G4700 Insomnia, unspecified: Secondary | ICD-10-CM | POA: Diagnosis not present

## 2019-04-14 MED ORDER — TRAZODONE HCL 50 MG PO TABS: 25.0000 mg | ORAL_TABLET | Freq: Every evening | ORAL | 3 refills | Status: DC | PRN

## 2019-04-14 MED ORDER — ESCITALOPRAM OXALATE 5 MG PO TABS: 5.0000 mg | ORAL_TABLET | Freq: Every day | ORAL | 3 refills | Status: DC

## 2019-04-14 NOTE — Progress Notes (Signed)
Patient: Lindsey Vaughan Female    DOB: 01-Dec-1940   78 y.o.   MRN: [id number] Visit Date: 04/14/2019  Today's Provider: [name], PA-C   Chief Complaint  Patient presents with  . Fall   Subjective:     Fall The accident occurred more than 1 week ago. The fall occurred while walking (patient reports she tripped over a rock). She landed on grass. The pain is present in the left foot. The symptoms are aggravated by movement, standing and pressure on injury. Pertinent negatives include no abdominal pain, bowel incontinence, fever, headaches, hearing loss, hematuria, loss of consciousness, nausea, numbness, tingling, visual change or vomiting. She has tried ice for the symptoms.   [place] a couple of weeks at [employer]. Her left foot inverted. She was able to walk after the injury. She is still reporting some pain in her left foot.  She reports she is having trouble sleeping. She is in the process of moving. She also reports that her son in law has been diagnosed with brain cancer and has been given a poor prognosis. Previously was on lexapro 5 mg daily but discontinued because she felt like she no longer needed it.   Allergies  Allergen Reactions  . Other      Current Outpatient Medications:  .  b complex vitamins tablet, Take 1 tablet by mouth daily.  , Disp: , Rfl:  .  Calcium Carbonate-Vit D-Min (CALCIUM/VITAMIN D/MINERALS) 600-200 MG-UNIT TABS, Take 1 tablet by mouth daily.  , Disp: , Rfl:  .  estradiol (ESTRACE) 0.1 MG/GM vaginal cream, Place 1 Applicatorful vaginally at bedtime. Use pea sized application  to urethra at bedtime., Disp: 42.5 g, Rfl: 12 .  LORazepam (ATIVAN) 0.5 MG tablet, Take 2 tablets (1 mg total) by mouth at bedtime as needed for anxiety., Disp: 20 tablet, Rfl: 0 .  pantoprazole (PROTONIX) 40 MG tablet, TK 1 T PO QD 1 HOUR BEFORE A MEAL, Disp: , Rfl: 9 .  Probiotic Product (PROBIOTIC & ACIDOPHILUS EX ST) CAPS, Take by mouth., Disp: , Rfl:  .   propranolol (INDERAL) 20 MG tablet, Take 1 tablet (20 mg total) by mouth 3 (three) times daily as needed., Disp: 90 tablet, Rfl: 3 .  simvastatin (ZOCOR) 20 MG tablet, TAKE 1 TABLET BY MOUTH AT BEDTIME FOR CHOLESTEROL, Disp: 90 tablet, Rfl: 4 .  sucralfate (CARAFATE) 1 g tablet, , Disp: , Rfl: 3 .  escitalopram (LEXAPRO) 5 MG tablet, TAKE 1 TABLET BY MOUTH DAILY (Patient not taking: Reported on 04/14/2019), Disp: 90 tablet, Rfl: 3  Review of Systems  Constitutional: Negative for fever.  Gastrointestinal: Negative for abdominal pain, bowel incontinence, nausea and vomiting.  Genitourinary: Negative for hematuria.  Neurological: Negative for tingling, loss of consciousness, numbness and headaches.    Social History   Tobacco Use  . Smoking status: Never Smoker  . Smokeless tobacco: Never Used  . Tobacco comment: tobacco use - no   Substance Use Topics  . Alcohol use: Yes    Alcohol/week: 2.0 standard drinks    Types: 2 Glasses of wine per week    Comment: 2 glasses of wine on weekend       Objective:   BP (!) 148/77   Pulse 77   Temp 98.3 F (36.8 C) (Oral)   Resp 16   Wt 140 lb (63.5 kg)   SpO2 95%   BMI 22.26 kg/m  Vitals:   04/14/19 1429  BP: Marland Kitchen)  148/77  Pulse: 77  Resp: 16  Temp: 98.3 F (36.8 C)  TempSrc: Oral  SpO2: 95%  Weight: 140 lb (63.5 kg)     Physical Exam Constitutional:      Appearance: Normal appearance.  Musculoskeletal:     Left ankle: She exhibits normal range of motion, no swelling, no ecchymosis, no deformity, no laceration and normal pulse. Tenderness. AITFL tenderness found. No lateral malleolus, no medial malleolus, no CF ligament, no posterior TFL, no head of 5th metatarsal and no proximal fibula tenderness found.  Skin:    General: Skin is warm and dry.  Neurological:     Mental Status: She is alert and oriented to person, place, and time. Mental status is at baseline.  Psychiatric:        Mood and Affect: Mood normal.         Behavior: Behavior normal.      No results found for any visits on 04/14/19.     Assessment & Plan    1. Sprain of anterior talofibular ligament of left ankle, initial encounter  Counseled on course of ankle sprain, pain can persist from weeks. Overall, exam benign. Do not think she needs xray at this point, patient agrees and declines imaging.   2. Insomnia, unspecified type  Suspect her insomnia is related to her anxiety. Will give medication as below but I think her stressors especially relating to her son in law will not resolve in a finite amount of time. I suggested she resume her lexapro and she will consider this. Refilled as below.   - traZODone (DESYREL) 50 MG tablet; Take 0.5-1 tablets (25-50 mg total) by mouth at bedtime as needed for sleep.  Dispense: 30 tablet; Refill: 3  3. Anxiety state  - escitalopram (LEXAPRO) 5 MG tablet; Take 1 tablet (5 mg total) by mouth daily.  Dispense: 90 tablet; Refill: 3  The entirety of the information documented in the History of Present Illness, Review of Systems and Physical Exam were personally obtained by me. Portions of this information were initially documented by [name], CMA and reviewed by me for thoroughness and accuracy.   F/u PRN      [name], PA-C  [facility] [name] as a scribe for [name], PA-C.,have documented all relevant documentation on the behalf of [name], PA-C,as directed by  [name], PA-C while in the presence of [name], PA-C.

## 2019-04-14 NOTE — Patient Instructions (Signed)

## 2019-05-05 ENCOUNTER — Ambulatory Visit: Payer: Medicare Other | Admitting: Family Medicine

## 2019-05-06 ENCOUNTER — Ambulatory Visit (INDEPENDENT_AMBULATORY_CARE_PROVIDER_SITE_OTHER): Payer: Medicare Other | Admitting: Family Medicine

## 2019-05-06 ENCOUNTER — Encounter: Payer: Self-pay | Admitting: Family Medicine

## 2019-05-06 ENCOUNTER — Other Ambulatory Visit: Payer: Self-pay

## 2019-05-06 VITALS — BP 120/68 | HR 73 | Temp 96.8°F | Resp 16 | Wt 140.0 lb

## 2019-05-06 DIAGNOSIS — G4709 Other insomnia: Secondary | ICD-10-CM

## 2019-05-06 DIAGNOSIS — S39012A Strain of muscle, fascia and tendon of lower back, initial encounter: Secondary | ICD-10-CM

## 2019-05-06 DIAGNOSIS — Z23 Encounter for immunization: Secondary | ICD-10-CM

## 2019-05-06 MED ORDER — LORAZEPAM 0.5 MG PO TABS: 0.5000 mg | ORAL_TABLET | Freq: Every evening | ORAL | 0 refills | Status: AC | PRN

## 2019-05-06 NOTE — Progress Notes (Signed)
Patient: Lindsey Vaughan Female    DOB: Apr 24, 1941   78 y.o.   MRN: [id number] Visit Date: 05/06/2019  Today's Provider: Lelon Huh, MD   Chief Complaint  Patient presents with  . Follow-up   Subjective:     HPI  Follow up for Insomnia:  The patient was last seen for this 3 weeks ago. Changes made at last visit include starting Trazodone.  She reports good compliance with treatment. She feels that condition is Unchanged. Patient feels that the medication has not helped. She still has trouble staying asleep. She has even tried taking 2 at bedtime but still wakes up after just a few hours.  She reports that she was prescribed lorazepam last year when she took a trip to [place] and worked very well. She doesn't always have trouble sleeping, but is not in the process of moving to [place] and all the activity seems to be effecting her sleep. She thinks this all temporary, so she decided not to start back on venlafaxine as previously discussed.  She is not having side effects.   ------------------------------------------------------------------------------------  Allergies  Allergen Reactions  . Other      Current Outpatient Medications:  .  b complex vitamins tablet, Take 1 tablet by mouth daily.  , Disp: , Rfl:  .  Calcium Carbonate-Vit D-Min (CALCIUM/VITAMIN D/MINERALS) 600-200 MG-UNIT TABS, Take 1 tablet by mouth daily.  , Disp: , Rfl:  .  escitalopram (LEXAPRO) 5 MG tablet, Take 1 tablet (5 mg total) by mouth daily., Disp: 90 tablet, Rfl: 3 .  estradiol (ESTRACE) 0.1 MG/GM vaginal cream, Place 1 Applicatorful vaginally at bedtime. Use pea sized application  to urethra at bedtime., Disp: 42.5 g, Rfl: 12 .  LORazepam (ATIVAN) 0.5 MG tablet, Take 2 tablets (1 mg total) by mouth at bedtime as needed for anxiety., Disp: 20 tablet, Rfl: 0 .  pantoprazole (PROTONIX) 40 MG tablet, TK 1 T PO QD 1 HOUR BEFORE A MEAL, Disp: , Rfl: 9 .  Probiotic Product (PROBIOTIC &  ACIDOPHILUS EX ST) CAPS, Take by mouth., Disp: , Rfl:  .  propranolol (INDERAL) 20 MG tablet, Take 1 tablet (20 mg total) by mouth 3 (three) times daily as needed., Disp: 90 tablet, Rfl: 3 .  simvastatin (ZOCOR) 20 MG tablet, TAKE 1 TABLET BY MOUTH AT BEDTIME FOR CHOLESTEROL, Disp: 90 tablet, Rfl: 4 .  sucralfate (CARAFATE) 1 g tablet, , Disp: , Rfl: 3 .  traZODone (DESYREL) 50 MG tablet, Take 0.5-1 tablets (25-50 mg total) by mouth at bedtime as needed for sleep., Disp: 30 tablet, Rfl: 3  Review of Systems  Constitutional: Negative for appetite change, chills, fatigue and fever.  Respiratory: Negative for chest tightness and shortness of breath.   Cardiovascular: Negative for chest pain and palpitations.  Gastrointestinal: Negative for abdominal pain, nausea and vomiting.  Musculoskeletal: Positive for back pain.  Neurological: Negative for dizziness and weakness.  Psychiatric/Behavioral: Positive for sleep disturbance.    Social History   Tobacco Use  . Smoking status: Never Smoker  . Smokeless tobacco: Never Used  . Tobacco comment: tobacco use - no   Substance Use Topics  . Alcohol use: Yes    Alcohol/week: 2.0 standard drinks    Types: 2 Glasses of wine per week    Comment: 2 glasses of wine on weekend       Objective:   BP 120/68 (BP Location: Left Arm, Patient Position: Sitting, Cuff Size: Normal)   Pulse  73   Temp (!) 96.8 F (36 C) (Temporal)   Resp 16   Wt 140 lb (63.5 kg)   SpO2 95% Comment: room air  BMI 22.26 kg/m  Vitals:   05/06/19 0812  BP: 120/68  Pulse: 73  Resp: 16  Temp: (!) 96.8 F (36 C)  TempSrc: Temporal  SpO2: 95%  Weight: 140 lb (63.5 kg)  Body mass index is 22.26 kg/m.   Physical Exam  General appearance: alert, well developed, well nourished, cooperative and in no distress Head: Normocephalic, without obvious abnormality, atraumatic Respiratory: Respirations even and unlabored, normal respiratory rate Extremities: All extremities  are intact.  Skin: Skin color, texture, turgor normal. No rashes seen  Psych: Appropriate mood and affect. Neurologic: Mental status: Alert, oriented to person, place, and time, thought content appropriate.      Assessment & Plan    1. Other insomnia No benefit from up to 100mg  trazodone, but did not well with brief course of lorazepam in the past.  Prescription. - LORazepam (ATIVAN) 0.5 MG tablet; Take 1-2 tablets (0.5-1 mg total) by mouth at bedtime as needed for anxiety.  Dispense: 20 tablet; Refill: 0  2. Back strain, initial encounter She states she thinks pulled a muscle in her back packing boxes, but just start back swimming and states it feels much better the last couple of days.   3. Need for influenza vaccination  - Flu Vaccine QUAD High Dose(Fluad)     Lelon Huh, MD  [facility]

## 2019-09-23 ENCOUNTER — Other Ambulatory Visit: Payer: Self-pay | Admitting: Family Medicine

## 2019-09-23 DIAGNOSIS — E785 Hyperlipidemia, unspecified: Secondary | ICD-10-CM
